# Patient Record
Sex: Male | Born: 1965 | Race: White | Hispanic: No | Marital: Married | Smoking: Never smoker
Health system: Southern US, Community
[De-identification: ages and names within clinical notes are randomized; demographics above are authoritative.]

## PROBLEM LIST (undated history)

## (undated) DIAGNOSIS — I10 Essential (primary) hypertension: Secondary | ICD-10-CM

## (undated) DIAGNOSIS — K219 Gastro-esophageal reflux disease without esophagitis: Secondary | ICD-10-CM

## (undated) DIAGNOSIS — N529 Male erectile dysfunction, unspecified: Secondary | ICD-10-CM

## (undated) DIAGNOSIS — E291 Testicular hypofunction: Secondary | ICD-10-CM

## (undated) DIAGNOSIS — T7840XA Allergy, unspecified, initial encounter: Secondary | ICD-10-CM

## (undated) DIAGNOSIS — F988 Other specified behavioral and emotional disorders with onset usually occurring in childhood and adolescence: Secondary | ICD-10-CM

## (undated) DIAGNOSIS — T783XXA Angioneurotic edema, initial encounter: Secondary | ICD-10-CM

## (undated) HISTORY — DX: Allergy, unspecified, initial encounter: T78.40XA

## (undated) HISTORY — DX: Testicular hypofunction: E29.1

## (undated) HISTORY — PX: VASECTOMY: SHX75

## (undated) HISTORY — DX: Essential (primary) hypertension: I10

## (undated) HISTORY — DX: Gastro-esophageal reflux disease without esophagitis: K21.9

## (undated) HISTORY — DX: Angioneurotic edema, initial encounter: T78.3XXA

## (undated) HISTORY — PX: COLONOSCOPY: SHX174

## (undated) HISTORY — DX: Male erectile dysfunction, unspecified: N52.9

## (undated) HISTORY — DX: Other specified behavioral and emotional disorders with onset usually occurring in childhood and adolescence: F98.8

---

## 2003-12-10 HISTORY — PX: APPENDECTOMY: SHX54

## 2003-12-28 ENCOUNTER — Encounter (INDEPENDENT_AMBULATORY_CARE_PROVIDER_SITE_OTHER): Payer: Self-pay | Admitting: Specialist

## 2003-12-28 ENCOUNTER — Inpatient Hospital Stay (HOSPITAL_COMMUNITY): Admission: EM | Admit: 2003-12-28 | Discharge: 2003-12-29 | Payer: Self-pay | Admitting: Emergency Medicine

## 2004-06-24 ENCOUNTER — Emergency Department (HOSPITAL_COMMUNITY): Admission: EM | Admit: 2004-06-24 | Discharge: 2004-06-24 | Payer: Self-pay | Admitting: Family Medicine

## 2006-05-21 ENCOUNTER — Ambulatory Visit: Payer: Self-pay | Admitting: Family Medicine

## 2008-07-20 ENCOUNTER — Ambulatory Visit: Payer: Self-pay | Admitting: Family Medicine

## 2008-11-02 ENCOUNTER — Ambulatory Visit: Payer: Self-pay | Admitting: Family Medicine

## 2009-01-30 ENCOUNTER — Ambulatory Visit: Payer: Self-pay | Admitting: Family Medicine

## 2009-03-15 ENCOUNTER — Ambulatory Visit: Payer: Self-pay | Admitting: Family Medicine

## 2009-08-13 ENCOUNTER — Ambulatory Visit: Payer: Self-pay | Admitting: Family Medicine

## 2009-08-29 ENCOUNTER — Ambulatory Visit: Payer: Self-pay | Admitting: Family Medicine

## 2010-06-26 ENCOUNTER — Ambulatory Visit: Payer: Self-pay | Admitting: Family Medicine

## 2010-07-08 ENCOUNTER — Encounter (INDEPENDENT_AMBULATORY_CARE_PROVIDER_SITE_OTHER): Payer: BC Managed Care – PPO | Admitting: Family Medicine

## 2010-07-08 DIAGNOSIS — F528 Other sexual dysfunction not due to a substance or known physiological condition: Secondary | ICD-10-CM

## 2010-07-08 DIAGNOSIS — Z0000 Encounter for general adult medical examination without abnormal findings: Secondary | ICD-10-CM

## 2010-07-08 DIAGNOSIS — K219 Gastro-esophageal reflux disease without esophagitis: Secondary | ICD-10-CM

## 2010-07-08 DIAGNOSIS — E291 Testicular hypofunction: Secondary | ICD-10-CM

## 2010-07-08 DIAGNOSIS — Z79899 Other long term (current) drug therapy: Secondary | ICD-10-CM

## 2010-08-22 NOTE — H&P (Signed)
Craig Byrd, Craig Byrd               ACCOUNT NO.:  [id number]   MEDICAL RECORD NO.:  [id number]          PATIENT TYPE:  EMS   LOCATION:  ED                           FACILITY:  [facility]   PHYSICIAN:  Adolph Pollack, M.D.DATE OF BIRTH:  Dec 17, 1965   DATE OF ADMISSION:  12/28/2003  DATE OF DISCHARGE:                                HISTORY & PHYSICAL   CHIEF COMPLAINT:  Right lower quadrant abdominal pain.   HISTORY OF PRESENT ILLNESS:  This 45 year old male was in the car and had  the gradual onset of right flank pain that worsened and radiated around to  the right lower quadrant and became more fixed here.  He had some nausea and  subjective fever and chills.  He actually went to see Dr. Susann Givens and was  feeling a little better at that time.  Dr. Susann Givens felt he had some right  lower quadrant tenderness on exam and the urinalysis was apparently  unremarkable.  He subsequently asked me to see him in consultation.  He has  not had anything like this before.  He had very mild dysuria in Dr.  Jola Babinski office.  No diarrhea present.  He was anorexic.   PAST MEDICAL HISTORY:  1.  GERD.  2.  ADHD.   DRUG ALLERGIES:  None.   MEDICATIONS:  1.  Adderall.  2.  Prilosec.   SOCIAL HISTORY:  He is married.  He is a Building control surveyor.  No tobacco or  alcohol use.   FAMILY HISTORY:  Positive for diabetes mellitus in his father.   REVIEW OF SYSTEMS:  CARDIOVASCULAR:  Denies heart disease or hypertension.  PULMONARY:  No chronic lung disease, no pneumonia, no asthma, no TB.  GI:  He does have hiatal hernia and gastroesophageal reflux as mentioned.  No  peptic ulcer disease, no diverticulitis, no colitis, no melena or  hematochezia.  GU:  No kidney stones.  ENDOCRINE:  No diabetes, thyroid  disease, or hypercholesterolemia.  NEUROLOGIC:  No seizure disorders.  HEMATOLOGIC:  No bleeding disorders or blood clots.   PHYSICAL EXAMINATION:  GENERAL:  Well-developed, well-nourished male in no  acute distress.  Pleasant, cooperative.  VITAL SIGNS:  His temperature is 98.2 orally, blood pressure of 134/84,  pulse of 84.  HEENT:  Eyes:  Extraocular muscles are intact.  No icterus.  SKIN:  Warm and dry.  No jaundice.  NECK:  Supple without masses or obvious thyroid enlargement.  RESPIRATORY:  Breath sounds equal and clear.  Respirations unlabored.  CARDIOVASCULAR:  Heart demonstrates a regular rate and rhythm.  No murmur  heard.  No lower extremity edema.  ABDOMEN:  Soft.  There is some mild to moderate right lower quadrant  tenderness to palpation and percussion.  No obvious involuntary guarding.  No masses palpable.  No hernias noted.  GENITOURINARY:  No penile lesions noted.  MUSCULOSKELETAL:  Good muscle tone, full range of motion.  NEUROLOGIC:  Alert and oriented and answers questions appropriately.   LABORATORY DATA:  White blood cell count normal 8700 with no left shift.  Hemoglobin 14.9.   A CT scan  demonstrates findings consistent with an early acute appendicitis.   IMPRESSION:  Acute appendicitis.   PLAN:  Laparoscopic/possible open appendectomy.  The procedure and risks  were discussed with him and his wife.  The risks include but are not limited  to bleeding, infection, injury to intraabdominal organs such as intestine,  bladder, etc., risk of general anesthesia.  He seems to understand this and  agrees to proceed.      TJR/MEDQ  D:  12/28/2003  T:  12/29/2003  Job:  [id number]   cc:   Sharlot Gowda, M.D.  [address]  [place], [place] [address]  Fax: [phone]

## 2010-08-22 NOTE — Op Note (Signed)
NAMEFREDERICO, Craig Byrd               ACCOUNT NO.:  [id number]   MEDICAL RECORD NO.:  [id number]          PATIENT TYPE:  EMS   LOCATION:  ED                           FACILITY:  [facility]   PHYSICIAN:  Adolph Pollack, M.D.DATE OF BIRTH:  Mar 27, 1966   DATE OF PROCEDURE:  12/28/2003  DATE OF DISCHARGE:                                 OPERATIVE REPORT   PREOPERATIVE DIAGNOSES:  Acute appendicitis.   POSTOPERATIVE DIAGNOSES:  Acute appendicitis.   PROCEDURE:  Laparoscopic appendectomy.   SURGEON:  Adolph Pollack, M.D.   ANESTHESIA:  General.   INDICATIONS FOR PROCEDURE:  This is a 45 year old male otherwise fairly  healthy who developed some right flank pain and right lower quadrant pain.  Had normal white blood cell count. Physical examination was not that  impressive. A CT scan was performed and was consistent with early acute  appendicitis and now is brought to the operating room.   TECHNIQUE:  He was placed supine on the operating table and a general  anesthetic was administered.  The hair on the abdominal wall was clipped. A  Foley catheter was placed in the bladder. The abdominal wall was sterilely  prepped and draped. Dilute Marcaine solution was infiltrated in the  subumbilical region and a small subumbilical incision was made incising the  skin, subcutaneous tissue, midline fascia, and peritoneum sharply. A  pursestring suture was then placed around the fascial edges.  A Hasson  trocar was introduced into the peritoneal cavity and pneumoperitoneum  created by insufflation of CO2 gas.   Next, a laparoscope was introduced and no underlying visceral injury was  noted.  He was placed in a Trendelenburg position, the right side tilted  upward.  A 5 mm trocar was placed in the left lower quadrant and one in the  right upper quadrant.  The appendix was identified and was noted to be  inflamed but no evidence of rupture and there was mild fibrinous debris  present.  The  appendix was grasped at the mesoappendix and retracted  anteriorly. The mesoappendix was then divided with the harmonic scalpel down  to the base of the appendix.  The appendix was then amputated off the cecum  with the endoGIA stapler, placed into an Endopouch bag, then removed from  the abdominal cavity through the subumbilical port.   The Hasson trocar was replaced. The staple line was inspected and there was  bleeding from the medial aspect of it.  This was able to be controlled with  application of a single hemoclip. Following this, 1 liter of normal saline  was used to irrigate the area.  The fluid was evacuated. The staple line was  solid without leakage and no further bleeding was noted.   The laparoscope was then placed in the right upper quadrant trocar and the  left lower quadrant trocar was removed and was hemostatic. The subumbilical  trocar was removed and under laparoscopic vision, the fascial defect was  closed by tightening up and tying down the pursestring suture.  The  laparoscope was removed, the pneumoperitoneum was released and the remaining  5 mm right upper quadrant trocar was removed.   The skin incisions were then closed with 4-0 Monocryl subcuticular stitches.  Steri-Strips and sterile dressings were applied.   He tolerated the procedure without any apparent complications and was taken  to the recovery room in satisfactory condition.      TJR/MEDQ  D:  12/28/2003  T:  12/29/2003  Job:  [id number]   cc:   Sharlot Gowda, M.D.  [address]  [place], [place] [address]  Fax: [phone]

## 2010-10-02 ENCOUNTER — Ambulatory Visit (INDEPENDENT_AMBULATORY_CARE_PROVIDER_SITE_OTHER): Payer: BC Managed Care – PPO | Admitting: Family Medicine

## 2010-10-02 ENCOUNTER — Encounter: Payer: Self-pay | Admitting: Family Medicine

## 2010-10-02 VITALS — BP 138/88 | HR 76 | Temp 98.1°F | Ht 72.0 in | Wt 215.0 lb

## 2010-10-02 DIAGNOSIS — R05 Cough: Secondary | ICD-10-CM

## 2010-10-02 MED ORDER — PREDNISONE 10 MG PO KIT: PACK | ORAL | Status: DC

## 2010-10-02 MED ORDER — BENZONATATE 200 MG PO CAPS: 200.0000 mg | ORAL_CAPSULE | Freq: Three times a day (TID) | ORAL | Status: AC | PRN

## 2010-10-02 NOTE — Patient Instructions (Signed)
Cough may be contributed by post-nasal drip.  I recommend use of antihistamine, +/- decongestant (ie. Claritin or Zyrtec, without or without the "D"), along with Mucinex (plain or DM).  Tessalon should also help with the cough and not cause drowsiness.  If cough persists through the weekend, will try a short course of steroids.

## 2010-10-02 NOTE — Progress Notes (Signed)
45 year old male presents to the office with complaint of cough.  Cough began while at the beach 2 weeks ago.  Cough was productive of green phlegm, and "got deep quick".  Had a rx for Levaquin, but wasn't better after 5 days, so went to an urgent care 6/20.  Treated with z-pak, Flovent, and hydrocodone cough syrup.  Started getting better after the first few days, but cough has gotten worse.  Denies fevers.  Feels a little clammy today, possibly from coughing spells.  No longer is coughing up phlegm, but still feels some drainage in throat at night.  + tickle in throat, dry cough.  Past Medical History  Diagnosis Date  . Allergy   . GERD (gastroesophageal reflux disease)   . Hypogonadism male   . Erectile dysfunction   . ADD (attention deficit disorder)     Past Surgical History  Procedure Date  . Appendectomy   . Vasectomy     History   Social History  . Marital Status: Single    Spouse Name: N/A    Number of Children: N/A  . Years of Education: N/A   Occupational History  . Not on file.   Social History Main Topics  . Smoking status: Never Smoker   . Smokeless tobacco: Never Used  . Alcohol Use: Yes     1-2 beers once a month  . Drug Use: No  . Sexually Active: Not on file   Other Topics Concern  . Not on file   Social History Narrative  . No narrative on file    Family History  Problem Relation Age of Onset  . Hypertension Mother   . Crohn's disease Father   . Cancer Father 64    colon cancer  . Diabetes Father   . Hypertension Sister     Current outpatient prescriptions:fluticasone (FLOVENT HFA) 110 MCG/ACT inhaler, Inhale 2 puffs into the lungs 2 (two) times daily.  , Disp: , Rfl: ;  lisdexamfetamine (VYVANSE) 40 MG capsule, Take 40 mg by mouth as needed.  , Disp: , Rfl: ;  omeprazole (PRILOSEC) 20 MG capsule, Take 20 mg by mouth daily.  , Disp: , Rfl:  benzonatate (TESSALON) 200 MG capsule, Take 1 capsule (200 mg total) by mouth 3 (three) times daily as  needed for cough., Disp: 30 capsule, Rfl: 0;  PredniSONE 10 MG KIT, Take as directed, Disp: 21 each, Rfl: 0  No Known Allergies  ROS: Denies fevers, sinus pain, chest pain, palpitations, insomnia, GI symptoms, skin rash or other conerns.  PHYSICAL EXAM: BP 138/88  Pulse 76  Temp(Src) 98.1 F (36.7 C) (Oral)  Ht 6' (1.829 m)  Wt 215 lb (97.523 kg)  BMI 29.16 kg/m2 Well developed, pleasant male, with intermittent dry, hacky cough.  Speaking easily in full sentences, no distress HEENT:  PERRL, EOMI, conjunctiva clear. Nose: moderate mucosal edema, L>R with white stringy mucus.  Sinuses nontender.  OP clear without erythema. Neck:  No lymphadenopathy or mass Lungs: clear bilaterally.  Good air movement.  No wheezes, rales or ronchi.  No cough with forced expiration Skin: no rash  ASSESSMENT/PLAN: 1. Cough  benzonatate (TESSALON) 200 MG capsule, PredniSONE 10 MG KIT   Cough may be contributed by post-nasal drip.  Recommend use of antihistamine, +/- decongestant (ie. Claritin or Zyrtec, without or without the "D"), along with Mucinex (plain or DM).  Tessalon should also help with the cough and not cause drowsiness.  If cough persists through the weekend, will  try a short course of steroids.  Patient prefers to have steroid Rx sent to pharmacy and left on file, rather than potentially calling over the weekend to get Rx.  Risks and side effects of steroids were reviewed with patient.

## 2010-10-07 ENCOUNTER — Ambulatory Visit (INDEPENDENT_AMBULATORY_CARE_PROVIDER_SITE_OTHER): Payer: BC Managed Care – PPO | Admitting: Medical

## 2010-10-07 ENCOUNTER — Encounter: Payer: Self-pay | Admitting: Medical

## 2010-10-07 ENCOUNTER — Other Ambulatory Visit: Payer: Self-pay

## 2010-10-07 ENCOUNTER — Encounter: Payer: Self-pay | Admitting: Emergency Medicine

## 2010-10-07 VITALS — BP 142/90 | HR 79 | Temp 98.0°F | Ht 72.0 in | Wt 217.0 lb

## 2010-10-07 DIAGNOSIS — R05 Cough: Secondary | ICD-10-CM

## 2010-10-07 LAB — CBC WITH DIFFERENTIAL/PLATELET
Basophils Relative: 0 % (ref 0–1)
Eosinophils Relative: 1 % (ref 0–5)
HCT: 46.1 % (ref 39.0–52.0)
MCV: 82.3 fL (ref 78.0–100.0)
Neutro Abs: 6.5 10*3/uL (ref 1.7–7.7)
Neutrophils Relative %: 70 % (ref 43–77)
Platelets: 308 10*3/uL (ref 150–400)
WBC: 9.3 10*3/uL (ref 4.0–10.5)

## 2010-10-07 MED ORDER — HYDROCODONE-HOMATROPINE 5-1.5 MG/5ML PO SYRP: 5.0000 mL | ORAL_SOLUTION | ORAL | Status: AC | PRN

## 2010-10-07 MED ORDER — ALBUTEROL SULFATE HFA 108 (90 BASE) MCG/ACT IN AERS: 2.0000 | INHALATION_SPRAY | Freq: Four times a day (QID) | RESPIRATORY_TRACT | Status: DC | PRN

## 2010-10-07 NOTE — Patient Instructions (Signed)
We will refer you to pulmonology for ongoing cough.   For now, continue the following:  Mucinex DM twice daily.  Benadryl or Zyrtec twice daily.  Flovent, 1 inhalation twice daily.  Finish prednisone dose pack.  You can use albuterol inhaler 2 puffs as needed every 4-6 hours.   You can also use Tessalon Perles 3 times daily for cough OR Hycodan cough syrup every 4-6 hours.

## 2010-10-07 NOTE — Progress Notes (Signed)
Subjective:   HPI Here for c/o ongoing cough. He is here with his wife today. He has been seen 3 times in the past 3 weeks for same c/o.  He had been seen prior at Urgent Care while out of town, and was here just last week.  At this point he has had cough, throat congestion, and productive sputum x 3 wk.  He has completed a round of Levaquin and Zpak, has been on Hydrocodone based cough syrup, Tessalon Perles, Flovent, and this past week was given Prednisone taper.   Over the weekend he even began using Albuterol inhaler which helped the first time he used it, but didn't help any other times.  He is taking Mucinex and Benadryl as well.  Wife notes that prior to 3 years ago he never had any issue resolving infections/URIs.  However, in the past 3 years, it takes weeks with severe cough to get over illness.  He is confused to why he gets severe cough now.  His current cough is so bad that he can't get his breath at times.    Regarding exposures and factors, they do have 2 dogs and carpet in the home.  He has hx/o GERD.  He is a Building control surveyor, and is a Occupational hygienist.  He denies any chemical or other worrisome exposures.   He is a lifetime nonsmoker.  Denies hx/o lung or allergy problems.  Denies family hx/o lung or heart disease.  He does note scraping off a popcorn ceiling 1.5 years ago without using a mask.  No other exposure.    He has been flying and traveling back and forth to the beach, but no other long travel, no recent surgery or procedure, no hx/o cancer, DVT, or PE.    No other aggravating or relieving factors.  No other c/o.  The following portions of the patient's history were reviewed and updated as appropriate: allergies, current medications, past family history, past medical history, past social history, past surgical history and problem list.  Past Medical History  Diagnosis Date  . Allergy   . GERD (gastroesophageal reflux disease)   . Hypogonadism male   . Erectile dysfunction   . ADD  (attention deficit disorder)    Review of Systems Gen.: No weight loss, fever, chills, sweats, anorexia Skin, no redness, rash ENT: No sore throat, ear pain, runny nose, sinus pressure Heart: No chest pain, palpitations, edema Lungs: Positive short of breath with cough, no dyspnea on exertion, no wheezing, no hemoptysis Abdomen: No pain, nausea, vomiting, diarrhea     Objective:   Physical Exam  General appearance: alert, no distress, WD/WN, white male Skin: Warm, dry HEENT: Conjunctiva normal, TMs pearly, nares patent without discharge, pharynx without erythema  Neck: Non-tender, no lymphadenopathy, no mass, no thyromegaly Heart: RRR, normal S1, S2, no murmurs Lungs: CTA bilaterally, no wheezes, rhonchi, rales Abdomen: Nontender, no mass, no organomegaly Extremities: No edema, no clubbing, no calf tenderness     Assessment :    Encounter Diagnosis  Name Primary?  . Cough, persistent Yes      Plan:     CXR today with patchiness of left lower lung field overshadowing cardiac silhouette, otherwise no acute changes. We will send x-ray for over read.  CBC sent as well. I suspect his symptoms initially started as an infection or bronchitis, but other allergens or postnasal drip probably aggravated things. I advised that he continue over-the-counter antihistamine twice a day, Mucinex DM twice daily, Flovent twice daily, Hycodan  cough syrup every 4-6 hours when necessary, albuterol inhaler as needed, finish the prednisone taper.  Given his recurrence of similar episodes several times in the last few years, that have led to persistent severe cough despite reasonable medication, we will refer her to pulmonology for further evaluation.

## 2010-10-09 ENCOUNTER — Ambulatory Visit (INDEPENDENT_AMBULATORY_CARE_PROVIDER_SITE_OTHER): Payer: BC Managed Care – PPO | Admitting: Emergency Medicine

## 2010-10-09 ENCOUNTER — Encounter: Payer: Self-pay | Admitting: Emergency Medicine

## 2010-10-09 ENCOUNTER — Telehealth: Payer: Self-pay | Admitting: *Deleted

## 2010-10-09 VITALS — BP 118/80 | HR 94 | Temp 98.0°F | Ht 72.0 in | Wt 219.4 lb

## 2010-10-09 DIAGNOSIS — R059 Cough, unspecified: Secondary | ICD-10-CM

## 2010-10-09 DIAGNOSIS — R053 Chronic cough: Secondary | ICD-10-CM | POA: Insufficient documentation

## 2010-10-09 DIAGNOSIS — R05 Cough: Secondary | ICD-10-CM

## 2010-10-09 NOTE — Progress Notes (Signed)
Subjective:    Patient ID: Craig Byrd, male    DOB: 1965/11/02, 45 y.o.   MRN: [id number]  HPI 45 yo never smoker, little PMH, presents today for cough. He typically feels well, no daily cough, but he has episodic flares that can last for weeks to months. He began to have green mucous from nose and chest about 3.5 weeks ago, was treated for bronchitis w levaquin - the green mucous improved but he has continued to cough.    Review of Systems  As per HPI.   Past Medical History  Diagnosis Date  . Allergy   . GERD (gastroesophageal reflux disease)   . Hypogonadism male   . Erectile dysfunction   . ADD (attention deficit disorder)      Family History  Problem Relation Age of Onset  . Hypertension Mother   . Crohn's disease Father   . Cancer Father 76    colon cancer  . Diabetes Father   . Hypertension Sister   . COPD Neg Hx      History   Social History  . Marital Status: Single    Spouse Name: N/A    Number of Children: N/A  . Years of Education: N/A   Occupational History  . commercial diver    Social History Main Topics  . Smoking status: Never Smoker   . Smokeless tobacco: Never Used  . Alcohol Use: Yes     1-2 beers once a month  . Drug Use: No  . Sexually Active: Not on file   Other Topics Concern  . Not on file   Social History Narrative  . No narrative on file     No Known Allergies   Outpatient Prescriptions Prior to Visit  Medication Sig Dispense Refill  . albuterol (VENTOLIN HFA) 108 (90 BASE) MCG/ACT inhaler Inhale 2 puffs into the lungs every 6 (six) hours as needed for wheezing.  1 each  0  . benzonatate (TESSALON) 200 MG capsule Take 1 capsule (200 mg total) by mouth 3 (three) times daily as needed for cough.  30 capsule  0  . diphenhydrAMINE (BENADRYL ALLERGY) 25 mg capsule Take 25 mg by mouth every 6 (six) hours as needed.        . fluticasone (FLOVENT HFA) 110 MCG/ACT inhaler Inhale 2 puffs into the lungs 2 (two) times daily.        Marland Kitchen  HYDROcodone-homatropine (HYCODAN) 5-1.5 MG/5ML syrup Take 5 mLs by mouth every 4 (four) hours as needed for cough.  120 mL  0  . lisdexamfetamine (VYVANSE) 40 MG capsule Take 40 mg by mouth as needed.        Marland Kitchen omeprazole (PRILOSEC) 20 MG capsule Take 20 mg by mouth daily.        . PredniSONE 10 MG KIT Take as directed  21 each  0         Objective:   Physical Exam  Gen: Pleasant, well-nourished, in no distress,  normal affect  ENT: No lesions,  mouth clear,  oropharynx clear, no postnasal drip, hoarse voice  Neck: No JVD, no TMG, no carotid bruits  Lungs: No use of accessory muscles, no dullness to percussion, clear without rales or rhonchi  Cardiovascular: RRR, heart sounds normal, no murmur or gallops, no peripheral edema  Musculoskeletal: No deformities, no cyanosis or clubbing  Neuro: alert, non focal  Skin: Warm, no lesions or rashes        Assessment & Plan:  Chronic cough Please start  taking your omeprazole 20mg  twice a day for the next 2 weeks Start taking loratadine 10mg  daily in addition to your evening benadryl Stop flovent Keep your albuterol available to use as needed Follow up with Dr Delton Coombes in 3 -4 weeks

## 2010-10-09 NOTE — Patient Instructions (Signed)
Please start taking your omeprazole 20mg twice a day for the next 2 weeks Start taking loratadine 10mg daily in addition to your evening benadryl Stop flovent Keep your albuterol available to use as needed Follow up with Dr Jerrel Tiberio in 3 -4 weeks 

## 2010-10-09 NOTE — Assessment & Plan Note (Signed)
Please start taking your omeprazole 20mg  twice a day for the next 2 weeks Start taking loratadine 10mg  daily in addition to your evening benadryl Stop flovent Keep your albuterol available to use as needed Follow up with Dr Delton Coombes in 3 -4 weeks

## 2010-10-09 NOTE — Telephone Encounter (Addendum)
Message copied by Dorthula Perfect on Thu Oct 09, 2010  8:06 AM ------      Message from: Aleen Campi, DAVID S      Created: Thu Oct 09, 2010  7:43 AM       Blood count is normal.  C/t with plan to see pulmonology.   Pt notified of lab results.  Scheduled to see pulmonologist 10-09-10 at 10:30 am and pt was reminded.  CM,LPN

## 2010-11-03 ENCOUNTER — Telehealth: Payer: Self-pay

## 2010-11-03 MED ORDER — CLINDAMYCIN HCL 150 MG PO CAPS: 150.0000 mg | ORAL_CAPSULE | Freq: Three times a day (TID) | ORAL | Status: AC

## 2010-11-03 MED ORDER — CLINDAMYCIN HCL 150 MG PO CAPS: 150.0000 mg | ORAL_CAPSULE | Freq: Three times a day (TID) | ORAL | Status: DC

## 2010-11-03 NOTE — Telephone Encounter (Signed)
Sent Clindamycin 150mg  1 po TID x 10 days #30 with no refills  to [facility] at [phone].  Pt aware.  CM, LPN

## 2010-11-03 NOTE — Telephone Encounter (Signed)
:   The clindamycin 150mg   3 times a day for 10 days

## 2010-11-03 NOTE — Telephone Encounter (Signed)
Apparently the patient saw Dr. Joneen Roach has been put on clindamycin. It did help over Dr. Haroldine Laws is out of his office. I will give him another 10 days he will follow up with both me and Haroldine Laws

## 2010-11-03 NOTE — Telephone Encounter (Signed)
Craig Byrd still no better x 7 weeks has seen Dr.Knapp and Shane over this course also he has been sent to ENT and Pulmonary with no result pt is addiment about you please call him and advise of where to go from here

## 2010-11-05 ENCOUNTER — Other Ambulatory Visit: Payer: Self-pay

## 2010-11-05 MED ORDER — OMEPRAZOLE 20 MG PO CPDR: 20.0000 mg | DELAYED_RELEASE_CAPSULE | Freq: Every day | ORAL | Status: DC

## 2010-11-07 ENCOUNTER — Encounter: Payer: Self-pay | Admitting: Family Medicine

## 2010-11-13 ENCOUNTER — Ambulatory Visit: Payer: BC Managed Care – PPO | Admitting: Emergency Medicine

## 2010-11-20 ENCOUNTER — Encounter: Payer: Self-pay | Admitting: Family Medicine

## 2010-11-20 ENCOUNTER — Ambulatory Visit (INDEPENDENT_AMBULATORY_CARE_PROVIDER_SITE_OTHER): Payer: BC Managed Care – PPO | Admitting: Family Medicine

## 2010-11-20 VITALS — BP 128/82 | HR 90 | Wt 216.0 lb

## 2010-11-20 DIAGNOSIS — R05 Cough: Secondary | ICD-10-CM

## 2010-11-20 NOTE — Progress Notes (Signed)
  Subjective:    Patient ID: Craig Byrd, male    DOB: [birth year], 45 y.o.   MRN: [id number]  HPI  he is here for evaluation of continued difficulty with coughing. He has been on multiple antibiotics, seen by pulmonary as well as ENT. He was placed on clindamycin by ENT which apparently also helps slightly. I renew that medication and he states that he was even better after completing that. He states that he was 95% better after finishing the second dose of clindamycin There is some evidence of reflux. He has been taking Prilosec 2 per day and but now is only taking one a day. He was last seen by ENT on August 8. Apparently the esophagitis was apparently improved. He was also seen recently in the emergency room because of rib pain do to coughing. Cough is getting worse and he is noting a slight hoarseness in his voice. Also some PND. He has had a Z-Pak, Levaquin and steroids for his cough in the past.  Review of Systems     Objective:   Physical Exam the notes from ENT and pulmonary were reviewed alert and in no distress. Tympanic membranes and canals are normal. Throat is clear. Tonsils are normal. Neck is supple without adenopathy or thyromegaly. Cardiac exam shows a regular sinus rhythm without murmurs or gallops. Lungs are clear to auscultation.        Assessment & Plan:  Chronic cough I will start initially with Prilosec 40 mg. He is to call me in 10 days and we will proceed further at that point. Do not think that antibiotics at this point would be prudent

## 2010-11-20 NOTE — Patient Instructions (Signed)
Take 2 Prilosec every day and call me in 10 days

## 2011-03-11 ENCOUNTER — Other Ambulatory Visit: Payer: Self-pay | Admitting: Internal Medicine

## 2011-03-12 ENCOUNTER — Other Ambulatory Visit: Payer: Self-pay

## 2011-03-12 MED ORDER — TESTOSTERONE 30 MG/ACT TD SOLN: 2.0000 "application " | TRANSDERMAL | Status: DC

## 2011-03-12 NOTE — Telephone Encounter (Signed)
Looks like jcl has already ordered pt will set up appt

## 2011-03-12 NOTE — Telephone Encounter (Signed)
Pt told to make a med check appt called med in for 30 days

## 2011-03-12 NOTE — Telephone Encounter (Signed)
He needs an appointment for followup including blood work

## 2011-08-04 ENCOUNTER — Telehealth: Payer: Self-pay | Admitting: Internal Medicine

## 2011-08-04 MED ORDER — OMEPRAZOLE 20 MG PO CPDR: 20.0000 mg | DELAYED_RELEASE_CAPSULE | Freq: Every day | ORAL | Status: DC

## 2011-08-04 NOTE — Telephone Encounter (Signed)
SENT MED IN 

## 2011-11-17 ENCOUNTER — Telehealth: Payer: Self-pay | Admitting: Family Medicine

## 2011-11-17 NOTE — Telephone Encounter (Signed)
Received call from Myrlene Broker, [facility] officer. 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

## 2012-03-18 ENCOUNTER — Other Ambulatory Visit: Payer: Self-pay | Admitting: Otolaryngology

## 2012-03-18 ENCOUNTER — Telehealth: Payer: Self-pay | Admitting: Family Medicine

## 2012-03-18 ENCOUNTER — Ambulatory Visit (INDEPENDENT_AMBULATORY_CARE_PROVIDER_SITE_OTHER): Payer: BC Managed Care – PPO | Admitting: Medical

## 2012-03-18 ENCOUNTER — Ambulatory Visit
Admission: RE | Admit: 2012-03-18 | Discharge: 2012-03-18 | Disposition: A | Discharge status: Home / Self Care | Payer: BC Managed Care – PPO | Source: Ambulatory Visit | Attending: Otolaryngology | Admitting: Otolaryngology

## 2012-03-18 ENCOUNTER — Encounter: Payer: Self-pay | Admitting: Medical

## 2012-03-18 ENCOUNTER — Telehealth: Payer: Self-pay | Admitting: Internal Medicine

## 2012-03-18 VITALS — BP 130/80 | HR 72 | Temp 98.2°F | Resp 16 | Wt 220.0 lb

## 2012-03-18 DIAGNOSIS — R053 Chronic cough: Secondary | ICD-10-CM

## 2012-03-18 DIAGNOSIS — R05 Cough: Secondary | ICD-10-CM

## 2012-03-18 DIAGNOSIS — K219 Gastro-esophageal reflux disease without esophagitis: Secondary | ICD-10-CM

## 2012-03-18 DIAGNOSIS — E291 Testicular hypofunction: Secondary | ICD-10-CM

## 2012-03-18 MED ORDER — OMEPRAZOLE 20 MG PO CPDR: 20.0000 mg | DELAYED_RELEASE_CAPSULE | Freq: Every day | ORAL | Status: DC

## 2012-03-18 NOTE — Telephone Encounter (Signed)
Message copied by Joslyn Hy on Fri Mar 18, 2012  4:17 PM ------      Message from: Jac Canavan      Created: Fri Mar 18, 2012  4:11 PM       i looked back in the chart.  I can't find a prior abnormal testosterone lab.  The last one we did was normal.  i can't justify prescribing testosterone without a low test result.  Thus, lets get the CXR and pulmonary studies regarding his main c/o cough.              F/u at his convenience for physical and to discuss testosterone, check labs to see if low.

## 2012-03-18 NOTE — Progress Notes (Signed)
Subjective: Here for c/o cough, similar to what we saw him for last year.  He notes that he caught a cold at Thanksgiving, turned into bronchitis.   Went to urgent care out of town, was put on antibiotic, this helped, but never fully got better over 50%.  subsequently saw ENT.  They treated with zpak, then with Levaquin when he didn't improve.  He notes feeling better, but only to about 80% or normal.  Dr. Joneen Roach advised that since he isn't completely resolving to come here for repeat CXR.  He is a nonsmoker.  In general he continues to get cough regularly.  Had workup last year after initial conservative measures weren't helping.  He says a specific cause wasn't determined.   He is taking omeprazole, but denies specific GERD and is not eating particular aggravating foods.  He does have slight nasal congestion, occasional sneezing, no sore throat, but no itching,m SOB, wheezing.  He denies work exposures.   He has 2 dogs, no other animal in the house.  Was on Androgel earlier this year, ended up stopping.  Wants to go back on TST therapy but wants shots instead.  Doesn't like to use the gels.    Past Medical History  Diagnosis Date  . Allergy   . GERD (gastroesophageal reflux disease)   . Hypogonadism male   . Erectile dysfunction   . ADD (attention deficit disorder)    ROS as in HPI    Objective:   Physical Exam  Filed Vitals:   03/18/12 1037  BP: 130/80  Pulse: 72  Temp: 98.2 F (36.8 C)  Resp: 16    General appearance: alert, no distress, WD/WN, white male HEENT: normocephalic, sclerae anicteric, TMs pearly, nares patent, no discharge or erythema, pharynx normal Oral cavity: MMM, no lesions Neck: supple, no lymphadenopathy, no thyromegaly, no masses Heart: RRR, normal S1, S2, no murmurs Lungs: CTA bilaterally, no wheezes, rhonchi, or rales Abdomen: +bs, soft, non tender, non distended, no masses, no hepatomegaly, no splenomegaly Pulses: 2+ symmetric Ext: no  edema   Assessment and Plan :    Encounter Diagnoses  Name Primary?  . Chronic cough Yes  . GERD (gastroesophageal reflux disease)   . Hypogonadism male    Chronic cough - reviewed pulmonology notes and OV notes from last year where we saw him multiple times for the same.   It was thought that he had GERD triggers of cough last year.   However, he has been using omeprazole regularly, and last year had used multiple antibiotics, PPIs, antihistamines, cough syrup, inhalers, and nothing seemed to fully resolve the cough.  Reviewed recent ENT notes.  At this point, repeat CXR, will set up PFTs.  May end up getting labs, but will start with this.  Begin Nexium samples.   Begin OTC Zyrtec if not improving in 1 wk.    GERD - refilled omeprazole, but don't start until after trial of Nexium  Hypogonadism - had been on Androgel prior.  Wants injections.  F/u in 40mo for physical, recheck on hypogonadism.  His wife is a Teacher, early years/pre and can administer the injections.   Follow-up pending studies.

## 2012-03-18 NOTE — Telephone Encounter (Signed)
Pt notified of shanes notes about testosterone

## 2012-03-18 NOTE — Patient Instructions (Signed)
Begin sample of daily Nexium, 1 capsule 30-45 minutes before breakfast.  Try this for 2 weeks.  If not improving, then add Zyrtec or Allegra allergy pill daily OTC.    We will get an updated chest xray and lungs studies.

## 2012-03-18 NOTE — Telephone Encounter (Signed)
Patient is aware of his PFT appointment on 04/08/11 @ 1000 am. CLS [facility]

## 2012-03-18 NOTE — Telephone Encounter (Signed)
Left a message for pt to call me back

## 2012-04-27 ENCOUNTER — Ambulatory Visit (INDEPENDENT_AMBULATORY_CARE_PROVIDER_SITE_OTHER): Payer: BC Managed Care – PPO | Admitting: Internal Medicine

## 2012-04-27 DIAGNOSIS — R05 Cough: Secondary | ICD-10-CM

## 2012-04-27 LAB — PULMONARY FUNCTION TEST

## 2012-04-27 NOTE — Progress Notes (Signed)
PFT done today. Katie Welchel,CMA  

## 2012-05-04 ENCOUNTER — Telehealth: Payer: Self-pay | Admitting: Family Medicine

## 2012-05-04 NOTE — Telephone Encounter (Signed)
Patient states that he is better and his cough his better as well. CLS  Patient is aware of his PFT results. CLS

## 2012-05-04 NOTE — Telephone Encounter (Signed)
Message copied by Janeice Robinson on Wed May 04, 2012  2:16 PM ------      Message from: Aleen Campi, DAVID S      Created: Wed May 04, 2012  4:31 AM       Call and see how he is doing?  Is he still coughing?             His pulmonary function studies were completely normal on 04/27/12

## 2012-05-04 NOTE — Telephone Encounter (Signed)
If the cough is completely gone, then fine, no need to return right away.  However, if some residual cough or other symptoms ongoing ( chest pain, SOB, wheezing, out of breath with exercise), then I would suggest follow up to explore/rule out other causes as asthma or lung disease doesn't seem to be the issue specifically.

## 2012-05-06 NOTE — Telephone Encounter (Signed)
Cough is completely gone. Crosby Oyster PA-C is aware of this. CLS

## 2012-09-29 ENCOUNTER — Telehealth: Payer: Self-pay | Admitting: Family Medicine

## 2012-09-29 MED ORDER — OMEPRAZOLE 20 MG PO CPDR: 20.0000 mg | DELAYED_RELEASE_CAPSULE | Freq: Every day | ORAL | Status: DC

## 2012-09-29 NOTE — Telephone Encounter (Signed)
SENT MED IN 

## 2012-12-07 ENCOUNTER — Ambulatory Visit (INDEPENDENT_AMBULATORY_CARE_PROVIDER_SITE_OTHER): Payer: BC Managed Care – PPO | Admitting: Family Medicine

## 2012-12-07 ENCOUNTER — Encounter: Payer: Self-pay | Admitting: Family Medicine

## 2012-12-07 VITALS — BP 122/82 | HR 60 | Wt 217.0 lb

## 2012-12-07 DIAGNOSIS — Z23 Encounter for immunization: Secondary | ICD-10-CM

## 2012-12-07 DIAGNOSIS — R6882 Decreased libido: Secondary | ICD-10-CM

## 2012-12-07 DIAGNOSIS — E291 Testicular hypofunction: Secondary | ICD-10-CM

## 2012-12-07 DIAGNOSIS — N529 Male erectile dysfunction, unspecified: Secondary | ICD-10-CM

## 2012-12-07 LAB — CBC WITH DIFFERENTIAL/PLATELET
Basophils Relative: 0 % (ref 0–1)
Eosinophils Absolute: 0.1 10*3/uL (ref 0.0–0.7)
Lymphocytes Relative: 22 % (ref 12–46)
Lymphs Abs: 1.6 10*3/uL (ref 0.7–4.0)
Monocytes Absolute: 0.6 10*3/uL (ref 0.1–1.0)
Monocytes Relative: 8 % (ref 3–12)
Neutrophils Relative %: 69 % (ref 43–77)
RDW: 13.8 % (ref 11.5–15.5)
WBC: 7.2 10*3/uL (ref 4.0–10.5)

## 2012-12-07 LAB — COMPREHENSIVE METABOLIC PANEL
ALT: 26 U/L (ref 0–53)
AST: 19 U/L (ref 0–37)
Alkaline Phosphatase: 58 U/L (ref 39–117)
BUN: 11 mg/dL (ref 6–23)
Calcium: 10.1 mg/dL (ref 8.4–10.5)
Chloride: 103 mEq/L (ref 96–112)
Creat: 1.06 mg/dL (ref 0.50–1.35)
Potassium: 4.5 mEq/L (ref 3.5–5.3)
Sodium: 140 mEq/L (ref 135–145)

## 2012-12-07 LAB — LIPID PANEL
Total CHOL/HDL Ratio: 5.2 Ratio
Triglycerides: 123 mg/dL (ref <150)

## 2012-12-07 MED ORDER — TESTOSTERONE 20.25 MG/1.25GM (1.62%) TD GEL: 2.0000 | TRANSDERMAL | Status: DC

## 2012-12-07 MED ORDER — VARDENAFIL HCL 10 MG PO TABS: 10.0000 mg | ORAL_TABLET | Freq: Every day | ORAL | Status: DC | PRN

## 2012-12-07 NOTE — Progress Notes (Signed)
  Subjective:    Patient ID: Craig Byrd, male    DOB: [birth year], 47 y.o.   MRN: [id number]  HPI He is here for recheck. Apparently in 2012 he was given testosterone however to the electronic record does not have it listed. He was given Axiron however he did not like this medication. He is also noting difficulty with erectile dysfunction. He had tried Cialis in the past with good success.   Review of Systems     Objective:   Physical Exam Alert and in no distress otherwise not examined       Assessment & Plan:  Libido, decreased - Plan: CBC with Differential, Comprehensive metabolic panel, Lipid panel, Testosterone, PSA  ED (erectile dysfunction) - Plan: vardenafil (LEVITRA) 10 MG tablet  Hypogonadism male - Plan: CBC with Differential, Comprehensive metabolic panel, Lipid panel, Testosterone, PSA  Immunization due - Plan: Flu Vaccine QUAD 36+ mos IM  I will get routine blood screening on him including testosterone and PSA. Levitra was covered better by his insurance and therefore that was given. He was also given a flu shot. Risks and benefits were discussed.

## 2012-12-08 ENCOUNTER — Other Ambulatory Visit: Payer: Self-pay

## 2012-12-08 ENCOUNTER — Telehealth: Payer: Self-pay | Admitting: Family Medicine

## 2012-12-08 NOTE — Progress Notes (Signed)
WIFE WANTED THE MEDS CHANGED

## 2012-12-08 NOTE — Telephone Encounter (Signed)
CALLED AND LEFT MESSAGE FOR AMY TO CALL BACK IF I DONT ANSWER PHONE TO PLEASE LEAVE ME A MESSAGE OF WHAT THEY WANT CHANGED AND I WILL DO MY BEST TO GET THIS DONE

## 2013-01-10 ENCOUNTER — Telehealth: Payer: Self-pay | Admitting: Family Medicine

## 2013-01-10 NOTE — Telephone Encounter (Signed)
Pt called for refill on Androgel. System show that he shld have 4 more refills left but pt states rcvd paper script & med states not refills. He filled at [facility]. [facility] verified pt had paper script is Sept with no refills. Pt wants refils sent to [facility] & a call to left him know that refill was sent.

## 2013-01-10 NOTE — Telephone Encounter (Signed)
Give him refills for the next 6 months

## 2013-01-11 ENCOUNTER — Other Ambulatory Visit: Payer: Self-pay

## 2013-01-11 MED ORDER — TESTOSTERONE 20.25 MG/1.25GM (1.62%) TD GEL: 2.0000 | TRANSDERMAL | Status: DC

## 2013-01-11 NOTE — Telephone Encounter (Signed)
androgel called in

## 2013-01-12 NOTE — Telephone Encounter (Signed)
Done

## 2013-02-16 ENCOUNTER — Telehealth: Payer: Self-pay | Admitting: Family Medicine

## 2013-02-16 NOTE — Telephone Encounter (Signed)
Pt called to see if he can just come in for labs to get his testosterone levels checked without having to be seen by you. Is this ok?

## 2013-02-16 NOTE — Telephone Encounter (Signed)
  For him to just have the testosterone drawn

## 2013-02-16 NOTE — Telephone Encounter (Signed)
Pt will come in tomorrow for a testosterone draw

## 2013-02-17 ENCOUNTER — Other Ambulatory Visit: Payer: BC Managed Care – PPO

## 2013-02-17 DIAGNOSIS — R7989 Other specified abnormal findings of blood chemistry: Secondary | ICD-10-CM

## 2013-02-18 LAB — TESTOSTERONE: Testosterone: 363 ng/dL (ref 300–890)

## 2013-02-20 ENCOUNTER — Other Ambulatory Visit: Payer: Self-pay

## 2013-02-20 DIAGNOSIS — R7989 Other specified abnormal findings of blood chemistry: Secondary | ICD-10-CM

## 2013-02-20 MED ORDER — TESTOSTERONE 20.25 MG/1.25GM (1.62%) TD GEL: 3.0000 | TRANSDERMAL | Status: DC

## 2013-02-20 NOTE — Progress Notes (Signed)
Quick Note:  PT IS AWARE MED CALLED IN APPOINTMENT 12/17 NURSE VISIT ______

## 2013-02-20 NOTE — Telephone Encounter (Signed)
CALLED UPDATE ON TEST. 3 PUMPS PER DAY I HAVE CALLED PT TO INFORM HIM OF REFILL AND TO COME BACK IN ONE MONTH FOR NURSE VISIT FOR RECHECK

## 2013-03-22 ENCOUNTER — Other Ambulatory Visit: Payer: BC Managed Care – PPO

## 2013-03-22 DIAGNOSIS — R7989 Other specified abnormal findings of blood chemistry: Secondary | ICD-10-CM

## 2013-03-22 LAB — TESTOSTERONE: Testosterone: 415 ng/dL (ref 300–890)

## 2013-04-11 ENCOUNTER — Telehealth: Payer: Self-pay | Admitting: Internal Medicine

## 2013-04-11 MED ORDER — OMEPRAZOLE 20 MG PO CPDR: 20.0000 mg | DELAYED_RELEASE_CAPSULE | Freq: Every day | ORAL | Status: DC

## 2013-04-11 NOTE — Telephone Encounter (Signed)
Refill request for omeprazole 20mg  #90 to [place]

## 2013-04-29 ENCOUNTER — Telehealth: Payer: Self-pay | Admitting: Family Medicine

## 2013-05-09 ENCOUNTER — Ambulatory Visit (INDEPENDENT_AMBULATORY_CARE_PROVIDER_SITE_OTHER): Payer: No Typology Code available for payment source | Admitting: Family Medicine

## 2013-05-09 ENCOUNTER — Other Ambulatory Visit: Payer: Self-pay | Admitting: *Deleted

## 2013-05-09 VITALS — BP 134/100 | HR 88 | Wt 226.0 lb

## 2013-05-09 DIAGNOSIS — E291 Testicular hypofunction: Secondary | ICD-10-CM

## 2013-05-09 DIAGNOSIS — N529 Male erectile dysfunction, unspecified: Secondary | ICD-10-CM

## 2013-05-09 MED ORDER — TADALAFIL 5 MG PO TABS: 5.0000 mg | ORAL_TABLET | Freq: Every day | ORAL | Status: DC | PRN

## 2013-05-09 MED ORDER — TESTOSTERONE 50 MG/5GM (1%) TD GEL: 5.0000 g | Freq: Every day | TRANSDERMAL | Status: DC

## 2013-05-09 NOTE — Telephone Encounter (Signed)
Go ahead and put him on the Testim and have them checked again in about a month

## 2013-05-09 NOTE — Telephone Encounter (Signed)
Called pharmacy & changed Androgel to Testim  But was told by pharmacist that cost would be $463.11 a month because pt has a $5,000 deductible.  She suggested injectable Depo testosterone 200mg  per ml, cost for 10 doses $86.49 which would be a 5 month supply if given twice a month.

## 2013-05-09 NOTE — Progress Notes (Signed)
   Subjective:    Patient ID: Craig Byrd, male    DOB: 01-08-1966, 48 y.o.   MRN: [id number]  HPI He is here for consult concerning hypogonadism. His insurance will not cover the medication as he has a very high deductible. He would also like a prescription for Cialis. He does have a previous history of erectile dysfunction and can get the medication for a period   Review of Systems     Objective:   Physical Exam Alert and in no distress otherwise not examined       Assessment & Plan:  Hypogonadism male  ED (erectile dysfunction) - Plan: tadalafil (CIALIS) 5 MG tablet  but discussed options concerning testosterone and will give him a compounded testosterone preparation. He is to use 2 cc at a time and return here in one month.

## 2013-05-09 NOTE — Telephone Encounter (Signed)
Testosterone 5% cream called into pharmacy. 70mL qd

## 2013-05-09 NOTE — Telephone Encounter (Signed)
P.A. Irena Reichmann denied, needs trial/failure of Testim.  Do you want to switch?

## 2013-05-10 NOTE — Telephone Encounter (Signed)
JCL switched pt to compounded

## 2013-06-07 ENCOUNTER — Encounter: Payer: Self-pay | Admitting: Family Medicine

## 2013-06-07 ENCOUNTER — Ambulatory Visit (INDEPENDENT_AMBULATORY_CARE_PROVIDER_SITE_OTHER): Payer: No Typology Code available for payment source | Admitting: Family Medicine

## 2013-06-07 VITALS — BP 120/90 | HR 72 | Wt 224.0 lb

## 2013-06-07 DIAGNOSIS — E291 Testicular hypofunction: Secondary | ICD-10-CM

## 2013-06-07 NOTE — Progress Notes (Signed)
   Subjective:    Patient ID: Jakobee Byrd, male    DOB: 01/13/1966, 48 y.o.   MRN: [id number]  HPI He is here for a recheck. He notes no change in his energy or stamina.  Review of Systems     Objective:   Physical Exam Alert and in no distress otherwise not examined      Assessment & Plan:  Hypogonadism male - Plan: Testosterone

## 2013-06-08 LAB — TESTOSTERONE: TESTOSTERONE: 531 ng/dL (ref 300–890)

## 2013-10-20 ENCOUNTER — Telehealth: Payer: Self-pay

## 2013-10-20 NOTE — Telephone Encounter (Signed)
Refill request for Omeprazole DR 20mg  #90 sent to [place]

## 2013-10-23 ENCOUNTER — Other Ambulatory Visit: Payer: Self-pay

## 2013-10-23 MED ORDER — OMEPRAZOLE 20 MG PO CPDR: 20.0000 mg | DELAYED_RELEASE_CAPSULE | Freq: Every day | ORAL | Status: DC

## 2013-10-23 NOTE — Telephone Encounter (Signed)
done

## 2013-10-23 NOTE — Telephone Encounter (Signed)
Sent in med as pt request

## 2014-02-06 ENCOUNTER — Ambulatory Visit
Admission: RE | Admit: 2014-02-06 | Discharge: 2014-02-06 | Disposition: A | Discharge status: Home / Self Care | Payer: No Typology Code available for payment source | Source: Ambulatory Visit | Attending: Otolaryngology | Admitting: Otolaryngology

## 2014-02-06 ENCOUNTER — Other Ambulatory Visit: Payer: Self-pay | Admitting: Otolaryngology

## 2014-02-06 DIAGNOSIS — R053 Chronic cough: Secondary | ICD-10-CM

## 2014-02-06 DIAGNOSIS — R05 Cough: Secondary | ICD-10-CM

## 2014-04-09 ENCOUNTER — Encounter: Payer: Self-pay | Admitting: Internal Medicine

## 2014-04-09 ENCOUNTER — Ambulatory Visit (INDEPENDENT_AMBULATORY_CARE_PROVIDER_SITE_OTHER): Payer: No Typology Code available for payment source | Admitting: Internal Medicine

## 2014-04-09 ENCOUNTER — Other Ambulatory Visit: Payer: No Typology Code available for payment source

## 2014-04-09 VITALS — BP 120/88 | HR 83 | Ht 72.0 in | Wt 231.2 lb

## 2014-04-09 DIAGNOSIS — R053 Chronic cough: Secondary | ICD-10-CM

## 2014-04-09 DIAGNOSIS — J3089 Other allergic rhinitis: Secondary | ICD-10-CM

## 2014-04-09 DIAGNOSIS — R05 Cough: Secondary | ICD-10-CM

## 2014-04-09 NOTE — Patient Instructions (Addendum)
Order- lab- Allergy profile     Dx seasonal and perennial allergic rhinitis  We may want to consider limited CT scan of sinuses, methacholine Inhalation Challenge

## 2014-04-09 NOTE — Progress Notes (Signed)
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 Pt reports having cough since 2007 - will be increased x 1-2 months when flares up. He denies history otherwise of food allergy, urticaria or asthma.  He associates cough with intervals of retro-orbital pressure and sinus congestion. Symptoms are perennial, made worse with viral head colds, exposure to cold temperatures and cold drinks. He was previously evaluated at this office by Dr. Lamonte Sakai for chronic cough with impression that his known reflux was the problem. He doesn't see that relationship. Reflux symptoms are controlled on PPIs. No history of intolerance to aspirin. Son is skin test positive for ragweed and grass. Father may have developed rhinitis symptoms in older age. Bananas cause uncomfortable feelings in his tongue CXR 02/06/14 IMPRESSION: No active cardiopulmonary disease. Electronically Signed  By: Lajean Manes M.D.  On: 02/06/2014 11:49  Prior to Admission medications   Medication Sig Start Date End Date Taking? Authorizing Provider  loratadine (CLARITIN) 10 MG tablet Take 10 mg by mouth daily.   Yes Historical Provider, MD  omeprazole (PRILOSEC) 20 MG capsule Take 1 capsule (20 mg total) by mouth daily. 10/23/13  Yes Denita Lung, MD  tadalafil (CIALIS) 5 MG tablet Take 1 tablet (5 mg total) by mouth daily as needed for erectile dysfunction. 05/09/13  Yes Denita Lung, MD   Past Medical History  Diagnosis Date  . Allergy   . GERD (gastroesophageal reflux disease)   . Hypogonadism male   . Erectile dysfunction   . ADD (attention deficit disorder)    Past Surgical History  Procedure Laterality Date  . Vasectomy    . Appendectomy  12/10/2003   Family History  Problem Relation Age of Onset  . Hypertension Mother   . Crohn's disease Father   . Cancer Father 38    colon cancer  . Diabetes Father   . Hypertension Sister   . COPD Neg Hx    History   Social  History  . Marital Status: Single    Spouse Name: N/A    Number of Children: N/A  . Years of Education: N/A   Occupational History  . commercial diver    Social History Main Topics  . Smoking status: Never Smoker   . Smokeless tobacco: Never Used  . Alcohol Use: Yes     Comment: 1-2 beers once a month  . Drug Use: No  . Sexual Activity: Not on file   Other Topics Concern  . Not on file   Social History Narrative   ROS-see HPI Constitutional:   No-   weight loss, night sweats, fevers, chills, fatigue, lassitude. HEENT:   No-  headaches, difficulty swallowing, tooth/dental problems, sore throat,       No-  sneezing, itching, ear ache, nasal congestion, post nasal drip,  CV:  No-   chest pain, orthopnea, PND, swelling in lower extremities, anasarca,                                  dizziness, palpitations Resp: No-   shortness of breath with exertion or at rest.              No-   productive cough,  +non-productive cough,  No- coughing up of blood.              No-   change in color of mucus.  No- wheezing.  Skin: No-   rash or lesions. GI:  No-   heartburn, indigestion, abdominal pain, nausea, vomiting, diarrhea,                 change in bowel habits, loss of appetite GU: No-   dysuria, change in color of urine, no urgency or frequency.  No- flank pain. MS:  No-   joint pain or swelling.  No- decreased range of motion.  No- back pain. Neuro-     nothing unusual Psych:  No- change in mood or affect. No depression or anxiety.  No memory loss.  OBJ- Physical Exam  tall healthy-appearing man General- Alert, Oriented, Affect-appropriate, Distress- none acute Skin- rash-none, lesions- none, excoriation- none Lymphadenopathy- none Head- atraumatic            Eyes- Gross vision intact, PERRLA, conjunctivae and secretions clear            Ears- Hearing, canals-normal            Nose- Clear, no-Septal dev, mucus, polyps, erosion, perforation             Throat- Mallampati III ,  mucosa clear , drainage- none, tonsils- atrophic Neck- flexible , trachea midline, no stridor , thyroid nl, carotid no bruit Chest - symmetrical excursion , unlabored           Heart/CV- RRR , no murmur , no gallop  , no rub, nl s1 s2                           - JVD- none , edema- none, stasis changes- none, varices- none           Lung- clear to P&A, wheeze- none, cough+dry , dullness-none, rub- none           Chest wall-  Abd- tender-no, distended-no, bowel sounds-present, HSM- no Br/ Gen/ Rectal- Not done, not indicated Extrem- cyanosis- none, clubbing, none, atrophy- none, strength- nl Neuro- grossly intact to observation

## 2014-04-10 LAB — ALLERGY FULL PROFILE
Allergen, D pternoyssinus,d7: <0.1 kU/L
Allergen,Goose feathers, e70: <0.1 kU/L
Aspergillus fumigatus, m3: <0.1 kU/L
Bahia Grass: <0.1 kU/L
Box Elder IgE: <0.1 kU/L
Cat Dander: <0.1 kU/L
Common Ragweed: <0.1 kU/L
D. farinae: <0.1 kU/L
Elm IgE: <0.1 kU/L
G005 Rye, Perennial: <0.1 kU/L
Goldenrod: <0.1 kU/L
Helminthosporium halodes: <0.1 kU/L
House Dust Hollister: <0.1 kU/L
IGE (IMMUNOGLOBULIN E), SERUM: 38 kU/L (ref <115)
Stemphylium Botryosum: <0.1 kU/L
Sycamore Tree: <0.1 kU/L

## 2014-04-10 NOTE — Assessment & Plan Note (Signed)
Question if cough reflects chronic sinus disease/allergy. He is working with ENT/Dr. Ernesto Rutherford can decide if a CT scan of sinuses or similar evaluation is appropriate. Plan-allergy profile, discussion of environmental exposures. Consider skin testing later if needed.

## 2014-05-31 ENCOUNTER — Telehealth: Payer: Self-pay | Admitting: Internal Medicine

## 2014-05-31 NOTE — Telephone Encounter (Signed)
I can certainly look and see if it's going to be easy to remove but we don't always remove fragments. Most of the time we leave them in the lesser causing difficulty
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
## 2014-05-31 NOTE — Telephone Encounter (Signed)
Patient said he would call back and make appointment

## 2014-06-07 ENCOUNTER — Telehealth: Payer: Self-pay | Admitting: Internal Medicine

## 2014-06-07 NOTE — Telephone Encounter (Signed)
Rec'd from Dr.Crossby forward 2 pages to [facility]

## 2014-06-08 ENCOUNTER — Ambulatory Visit: Payer: No Typology Code available for payment source | Admitting: Internal Medicine

## 2015-04-09 ENCOUNTER — Telehealth: Payer: Self-pay

## 2015-04-09 ENCOUNTER — Encounter: Payer: Self-pay | Admitting: Internal Medicine

## 2015-04-09 NOTE — Telephone Encounter (Signed)
Pt needs colonoscopy referral. He falls under the high risk category, and didn't get one at age 50 like he was supposed to.

## 2015-04-10 NOTE — Telephone Encounter (Signed)
His father has Crohn's disease and colon ca at age 48. Recommend colonoscopy at age 63 however I will give stool cards

## 2015-05-20 ENCOUNTER — Ambulatory Visit: Payer: No Typology Code available for payment source | Admitting: Internal Medicine

## 2015-08-05 ENCOUNTER — Other Ambulatory Visit (INDEPENDENT_AMBULATORY_CARE_PROVIDER_SITE_OTHER): Payer: No Typology Code available for payment source

## 2015-08-05 DIAGNOSIS — K50919 Crohn's disease, unspecified, with unspecified complications: Secondary | ICD-10-CM

## 2015-08-05 LAB — HEMOCCULT GUIAC POC 1CARD (OFFICE)
Card #2 Fecal Occult Blod, POC: NEGATIVE
Card #3 Fecal Occult Blood, POC: NEGATIVE
Fecal Occult Blood, POC: NEGATIVE

## 2015-08-22 ENCOUNTER — Ambulatory Visit (INDEPENDENT_AMBULATORY_CARE_PROVIDER_SITE_OTHER): Payer: No Typology Code available for payment source | Admitting: Family Medicine

## 2015-08-22 ENCOUNTER — Encounter: Payer: Self-pay | Admitting: Family Medicine

## 2015-08-22 ENCOUNTER — Encounter: Payer: Self-pay | Admitting: Medical

## 2015-08-22 ENCOUNTER — Ambulatory Visit (INDEPENDENT_AMBULATORY_CARE_PROVIDER_SITE_OTHER): Payer: Self-pay | Admitting: Medical

## 2015-08-22 VITALS — BP 130/90 | HR 86 | Ht 72.5 in | Wt 218.6 lb

## 2015-08-22 VITALS — BP 130/90 | HR 86 | Ht 72.5 in | Wt 218.0 lb

## 2015-08-22 DIAGNOSIS — E291 Testicular hypofunction: Secondary | ICD-10-CM

## 2015-08-22 DIAGNOSIS — Z0289 Encounter for other administrative examinations: Secondary | ICD-10-CM

## 2015-08-22 DIAGNOSIS — K219 Gastro-esophageal reflux disease without esophagitis: Secondary | ICD-10-CM

## 2015-08-22 DIAGNOSIS — J3089 Other allergic rhinitis: Secondary | ICD-10-CM | POA: Diagnosis not present

## 2015-08-22 DIAGNOSIS — I10 Essential (primary) hypertension: Secondary | ICD-10-CM

## 2015-08-22 DIAGNOSIS — Z800 Family history of malignant neoplasm of digestive organs: Secondary | ICD-10-CM | POA: Diagnosis not present

## 2015-08-22 DIAGNOSIS — N529 Male erectile dysfunction, unspecified: Secondary | ICD-10-CM | POA: Insufficient documentation

## 2015-08-22 DIAGNOSIS — Z0000 Encounter for general adult medical examination without abnormal findings: Secondary | ICD-10-CM | POA: Diagnosis not present

## 2015-08-22 LAB — COMPREHENSIVE METABOLIC PANEL
ALT: 23 U/L (ref 9–46)
AST: 18 U/L (ref 10–40)
Albumin: 4.8 g/dL (ref 3.6–5.1)
Alkaline Phosphatase: 48 U/L (ref 40–115)
BILIRUBIN TOTAL: 0.9 mg/dL (ref 0.2–1.2)
BUN: 20 mg/dL (ref 7–25)
CO2: 26 mmol/L (ref 20–31)
Calcium: 9.7 mg/dL (ref 8.6–10.3)
Chloride: 101 mmol/L (ref 98–110)
Creat: 1.15 mg/dL (ref 0.60–1.35)
Glucose, Bld: 98 mg/dL (ref 65–99)
Potassium: 4.3 mmol/L (ref 3.5–5.3)
SODIUM: 137 mmol/L (ref 135–146)
TOTAL PROTEIN: 7.4 g/dL (ref 6.1–8.1)

## 2015-08-22 LAB — POCT URINALYSIS DIPSTICK
Bilirubin, UA: NEGATIVE
Blood, UA: NEGATIVE
Glucose, UA: NEGATIVE
Ketones, UA: NEGATIVE
LEUKOCYTES UA: NEGATIVE
NITRITE UA: NEGATIVE
PH UA: 6
PROTEIN UA: NEGATIVE
Spec Grav, UA: 1.025
Urobilinogen, UA: NEGATIVE

## 2015-08-22 LAB — CBC WITH DIFFERENTIAL/PLATELET
BASOS PCT: 0 %
Basophils Absolute: 0 cells/uL (ref 0–200)
EOS PCT: 2 %
Eosinophils Absolute: 166 cells/uL (ref 15–500)
HCT: 47.3 % (ref 38.5–50.0)
HEMOGLOBIN: 16.5 g/dL (ref 13.2–17.1)
Lymphocytes Relative: 20 %
Lymphs Abs: 1660 cells/uL (ref 850–3900)
MCH: 27.8 pg (ref 27.0–33.0)
MCHC: 34.9 g/dL (ref 32.0–36.0)
MCV: 79.8 fL — ABNORMAL LOW (ref 80.0–100.0)
MPV: 8.4 fL (ref 7.5–12.5)
Monocytes Absolute: 415 cells/uL (ref 200–950)
Monocytes Relative: 5 %
Neutro Abs: 6059 cells/uL (ref 1500–7800)
Neutrophils Relative %: 73 %
Platelets: 264 10*3/uL (ref 140–400)
RBC: 5.93 MIL/uL — AB (ref 4.20–5.80)
RDW: 13.9 % (ref 11.0–15.0)
WBC: 8.3 10*3/uL (ref 4.0–10.5)

## 2015-08-22 LAB — LIPID PANEL
Cholesterol: 212 mg/dL — ABNORMAL HIGH (ref 125–200)
HDL: 40 mg/dL (ref >=40)
LDL CALC: 148 mg/dL — AB (ref <130)
TRIGLYCERIDES: 120 mg/dL (ref <150)
Total CHOL/HDL Ratio: 5.3 Ratio — ABNORMAL HIGH (ref <=5.0)
VLDL: 24 mg/dL (ref <30)

## 2015-08-22 MED ORDER — RANITIDINE HCL 150 MG PO TABS: 150.0000 mg | ORAL_TABLET | Freq: Two times a day (BID) | ORAL | Status: DC

## 2015-08-22 NOTE — Progress Notes (Signed)
Subjective:    Patient ID: Craig Byrd, male    DOB: 1965-07-01, 50 y.o.   MRN: [id number]  HPI  he is here for complete examination. He presently he is being evaluated by ENT and plans to go to allergy. He is having difficulty with a cough. He is taking Levaquin. He will be seeing allergy in the future to evaluate his allergies as being a factor in his coughing. He does have a history of hypogonadism and presently is taking a compounded medication consisting of testosterone 100 mg and DHEA 75 mg 5 pumps per day. His most recent blood work was on 4 pumps per day and he was in the 400 range. He also has underlying erectile dysfunction and is using Cialis. He has tried other medications but found them to be less useful and having more side effects. He is having a troche compounded for this. He also has reflux disease and has been on Prilosec however his wife who is a pharmacist would like him to stop this. Family and social history as well as health maintenance and immunizations were reviewed. His father was diagnosed with colon cancer at age 15   Review of Systems  All other systems reviewed and are negative.      Objective:   Physical Exam BP 130/90 mmHg  Pulse 86  Ht 6' 0.5" (1.842 m)  Wt 218 lb 9.6 oz (99.156 kg)  BMI 29.22 kg/m2  SpO2 96%  General Appearance:    Alert, cooperative, no distress, appears stated age  Head:    Normocephalic, without obvious abnormality, atraumatic  Eyes:    PERRL, conjunctiva/corneas clear, EOM's intact, fundi    benign  Ears:    Normal TM's and external ear canals  Nose:   Nares normal, mucosa normal, no drainage or sinus   tenderness  Throat:   Lips, mucosa, and tongue normal; teeth and gums normal  Neck:   Supple, no lymphadenopathy;  thyroid:  no   enlargement/tenderness/nodules; no carotid   bruit or JVD  Back:    Spine nontender, no curvature, ROM normal, no CVA     tenderness  Lungs:     Clear to auscultation bilaterally without wheezes,  rales or     ronchi; respirations unlabored  Chest Wall:    No tenderness or deformity   Heart:    Regular rate and rhythm, S1 and S2 normal, no murmur, rub   or gallop  Breast Exam:    No chest wall tenderness, masses or gynecomastia  Abdomen:     Soft, non-tender, nondistended, normoactive bowel sounds,    no masses, no hepatosplenomegaly  Genitalia:    Normal male external genitalia without lesions.  Testicles without masses.  No inguinal hernias.  Rectal:    Normal sphincter tone, no masses or tenderness; guaiac negative stool.  Prostate smooth, no nodules, not enlarged.  Extremities:   No clubbing, cyanosis or edema  Pulses:   2+ and symmetric all extremities  Skin:   Skin color, texture, turgor normal, no rashes or lesions  Lymph nodes:   Cervical, supraclavicular, and axillary nodes normal  Neurologic:   CNII-XII intact, normal strength, sensation and gait; reflexes 2+ and symmetric throughout          Psych:   Normal mood, affect, hygiene and grooming.          Assessment & Plan:  Routine general medical examination at a health care facility - Plan: POCT Urinalysis Dipstick, Visual acuity screening, CBC with  Differential/Platelet, Comprehensive metabolic panel, Lipid panel  Family history of colon cancer  Erectile dysfunction, unspecified erectile dysfunction type  Hypogonadism in male - Plan: PSA, Testosterone  Gastroesophageal reflux disease, esophagitis presence not specified - Plan: ranitidine (ZANTAC) 150 MG tablet  Other allergic rhinitis  he will switch to Zantac twice a day and then go down to the lowest dose possible for relief of symptoms. Also discussed the use of the compounded formulation. I will call this in when he needs it again. Recommend that they check online for a [place] to get Cialis to see if this will help with reducing the cost and if not I will write for the troche

## 2015-08-22 NOTE — Progress Notes (Signed)
Commercial Driver Medical Examination   Craig Byrd is a 50 y.o. male who presents today for a commercial driver fitness determination physical exam.  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    He works as a Office manager.   Last DOT physical 07/07/2012.  Medical care team includes:  Dr. Redmond School here for primary care  The patient reports no problems.  Review of Systems A comprehensive review of systems was reviewed and noted as below:  Eye: - corrective lenses, -lasik surgery or other eye surgery, -glaucoma, -cataracts, -macular degeneration, -monocular vision, -medication for eye condition, -blurred vision,   Ears: -hearing problems, - hearing aids, -ear pain, -ear drainage, -ear fullness, -tinnitus, -recurrent ear infection, -previous ear surgery, - vertigo, -meniere's disease  Endocrine: -polydipsia, -polyuria, -weight loss, -fainting, -dizziness, - altered or loss of consciousness, -hypoglycemia  Cardiovascular: -heart disease, -CHF, -heart attack, -cardiac stents, -bypass surgery, -other heart surgery, -hypertension, -blood clots, -pacemaker, -medications for heart condition, -chest pain, -SOB, -palpitations, -fainting, -dizziness, -dyspnea  Respiratory: -asthma, -COPD, other lung disease, -smoker, -chest tightness, - wheezing, -snoring, -daytime sleepiness, -sleep apnea or uses CPAP, -narcolepsy, -sleeping disorder  Allergy: -uncontrollable sneezing or allergy symptoms  Musculoskeletal: -missing body parts, -muscle disease, -bone disease, -spine injury, -low back pain, -medication for joints, bones, muscles or pain, -physical limitations, -joint pain, -neck pain, -limitations of neck ROM, -back surgery, orthopedic surgery, -rheumatologic condition, -gout  Neurologic: -neurologic disease, -dementia, -seizures, -parkinson's, -tremor, -memory problems, -weakness, -numbness, -tingling, -medication for neurologic condition,  -medications for sleep condition  Gastric: -abdominal pain, -chronic diarrhea or IBS, -uncontrollable nausea  Kidney/Renal: -hematuria, -dialysis, kidney disease, polycystic kidney disease  Psychiatric: -homicidal thoughts, -suicidal thoughts, -prior suicide attempts, -get into fights/hurting others, -memory or concentration problems, -delusions, -hallucinations, -hospitalization for mental health problem, -depression, -anxiety, -bipolar  Drug use: - none  Reviewed their medical, surgical, family, social, medication, and allergy history and updated chart as appropriate.     Objective:   Physical Exam  BP 130/90 mmHg  Pulse 86  Ht 6' 0.5" (1.842 m)  Wt 218 lb (98.884 kg)  BMI 29.14 kg/m2  General appearance: alert, no distress, WD/WN, white male Skin: scattered macules HEENT: normocephalic, conjunctiva/corneas normal, sclerae anicteric, PERRLA, EOMi, nares patent, no discharge or erythema, pharynx normal Oral cavity: MMM, tongue normal, teeth normal Neck: supple, no lymphadenopathy, no thyromegaly, no masses, normal ROM, no bruits Chest: non tender, normal shape and expansion Heart: RRR, normal S1, S2, no murmurs Lungs: CTA bilaterally, no wheezes, rhonchi, or rales Abdomen: +bs, soft, non tender, non distended, no masses, no hepatomegaly, no splenomegaly, no bruits Back: non tender, normal ROM, no scoliosis Musculoskeletal: upper extremities non tender, no obvious deformity, normal ROM throughout, lower extremities non tender, no obvious deformity, normal ROM throughout Extremities: no edema, no cyanosis, no clubbing Pulses: 2+ symmetric, upper and lower extremities, normal cap refill Neurological: alert, oriented x 3, CN2-12 intact, strength normal upper extremities and lower extremities, sensation normal throughout, DTRs 2+ throughout, no cerebellar signs, gait normal Psychiatric: normal affect, behavior normal, pleasant  GU: normal male external genitalia, nontender, no  masses, no hernia, no lymphadenopathy Rectal: deferred    Assessment:   Encounter Diagnoses  Name Primary?  . Health examination of defined subpopulation Yes  . Essential hypertension     Plan:    Medical examiners certificate completed and printed. Need follow-up in 3 month for recheck of bp.  New diagnosis stage 1 hypertension.  Gave 1 year certificate, advised f/u with Dr. Redmond School within 3 mo after trial of lifestyle changes, discussed diagnosis, possible complications.  He does monitor BP at home.

## 2015-08-23 LAB — TESTOSTERONE: Testosterone: 524 ng/dL (ref 250–827)

## 2015-08-23 LAB — PSA: PSA: 0.91 ng/mL (ref <=4.00)

## 2015-10-23 ENCOUNTER — Telehealth: Payer: Self-pay | Admitting: Family Medicine

## 2015-10-23 NOTE — Telephone Encounter (Signed)
Pt called and states that he needs a refill on his testosterone 100 mg DHEA 75 mg states you and him talked about this at his last visit. Pt would like [facility] to [facility] at [place], [place], [place] [address]  pt can be reached at [phone] (M) infmored him that you was out of the office today.

## 2015-10-23 NOTE — Telephone Encounter (Signed)
Call this in 

## 2015-10-24 ENCOUNTER — Other Ambulatory Visit: Payer: Self-pay

## 2015-10-24 NOTE — Telephone Encounter (Signed)
[facility]ed [facility]

## 2015-11-01 ENCOUNTER — Telehealth: Payer: Self-pay | Admitting: Family Medicine

## 2015-11-01 NOTE — Telephone Encounter (Signed)
Pt's wife, Amy, called stating that when pt's testosterone med was recently called in for him to [place] directions states to take 100 mg but wife said it was discussed with pt at last visit or after last visit that he was to take 125 mg. Wife said pt plan to take 125 mg as instructed but will run out of meds so will need refill sent in sooner and then will need script changed at pharmacy next time

## 2015-11-01 NOTE — Telephone Encounter (Signed)
Please straighten this out

## 2015-11-04 ENCOUNTER — Other Ambulatory Visit: Payer: Self-pay

## 2015-11-04 MED ORDER — NONFORMULARY OR COMPOUNDED ITEM: 0 refills | Status: DC

## 2015-11-04 NOTE — Telephone Encounter (Signed)
I have called [facility] and left message that a change to pt med from 100 mg to125 mg to hold med till refill was needed

## 2015-12-25 ENCOUNTER — Telehealth: Payer: Self-pay

## 2015-12-25 ENCOUNTER — Telehealth: Payer: Self-pay | Admitting: Family Medicine

## 2015-12-25 ENCOUNTER — Other Ambulatory Visit: Payer: Self-pay | Admitting: Medical

## 2015-12-25 MED ORDER — OMEPRAZOLE 20 MG PO CPDR: 20.0000 mg | DELAYED_RELEASE_CAPSULE | Freq: Every day | ORAL | 0 refills | Status: DC

## 2015-12-25 MED ORDER — OMEPRAZOLE 40 MG PO CPDR: 40.0000 mg | DELAYED_RELEASE_CAPSULE | Freq: Every day | ORAL | 0 refills | Status: DC

## 2015-12-25 NOTE — Telephone Encounter (Signed)
Spoke with pt- he was made aware. He states that he has been using omeprazole OTC  and it is working well. Prescriptions help with cost.

## 2015-12-25 NOTE — Telephone Encounter (Signed)
I sent omeprazole, have him f/u in 3-4 wk on this.

## 2015-12-25 NOTE — Telephone Encounter (Signed)
Omeprazole 20mg  [facility]

## 2015-12-25 NOTE — Telephone Encounter (Signed)
Fax rcvd from office requesting refill of omeprazole. However pt has been using Zantac with no relief in sx. He would like to restart omeprazole 20mg  and stop takign Zantac. Is this ok to renew to [facility].

## 2015-12-25 NOTE — Telephone Encounter (Signed)
Rcvd note from pharmacy states that pt has previously been on Omeprazole 20mg  but we sent in a script for 40mg  & pt does not want 40mg . Requesting [place] to resend Omeprazole script as 20mg .

## 2016-04-08 ENCOUNTER — Telehealth: Payer: Self-pay | Admitting: Family Medicine

## 2016-04-08 MED ORDER — NONFORMULARY OR COMPOUNDED ITEM: 0 refills | Status: DC

## 2016-04-08 NOTE — Telephone Encounter (Signed)
Requesting refill on Testosterone. If any questions, can speak to pt's wife, Amy, who is a Software engineer at [employer] where med should be called in.

## 2016-04-08 NOTE — Telephone Encounter (Signed)
He needs to come in for medication check but don't let them run out of the testosterone

## 2016-04-08 NOTE — Telephone Encounter (Signed)
Called and gave verbal to Amy- pharmacist and she was made aware to renew script for 30 days at current dose filled by [facility] of testosterone liboderm 20% 200mg  daily. She is aware pt needs appt for further refill.  Pt is also aware need for appt. Victorino December

## 2016-06-15 ENCOUNTER — Telehealth: Payer: Self-pay | Admitting: Family Medicine

## 2016-06-15 DIAGNOSIS — N529 Male erectile dysfunction, unspecified: Secondary | ICD-10-CM

## 2016-06-15 DIAGNOSIS — Z800 Family history of malignant neoplasm of digestive organs: Secondary | ICD-10-CM

## 2016-06-15 DIAGNOSIS — J3089 Other allergic rhinitis: Secondary | ICD-10-CM

## 2016-06-15 DIAGNOSIS — E291 Testicular hypofunction: Secondary | ICD-10-CM

## 2016-06-15 DIAGNOSIS — E785 Hyperlipidemia, unspecified: Secondary | ICD-10-CM

## 2016-06-15 NOTE — Telephone Encounter (Signed)
Set him up for regular appointment

## 2016-06-15 NOTE — Telephone Encounter (Signed)
Pt called and stated he was to come in for a testosterone lab check. Labs are not in system. Please place orders if ok and advise pt so he will know when to come in. Pt can be reached at [phone].

## 2016-06-16 ENCOUNTER — Other Ambulatory Visit: Payer: No Typology Code available for payment source

## 2016-06-16 DIAGNOSIS — E291 Testicular hypofunction: Secondary | ICD-10-CM

## 2016-06-16 DIAGNOSIS — Z800 Family history of malignant neoplasm of digestive organs: Secondary | ICD-10-CM

## 2016-06-16 DIAGNOSIS — E785 Hyperlipidemia, unspecified: Secondary | ICD-10-CM

## 2016-06-16 LAB — CBC WITH DIFFERENTIAL/PLATELET
Basophils Absolute: 63 cells/uL (ref 0–200)
Basophils Relative: 1 %
EOS ABS: 189 {cells}/uL (ref 15–500)
Eosinophils Relative: 3 %
HCT: 48.5 % (ref 38.5–50.0)
Hemoglobin: 16.5 g/dL (ref 13.2–17.1)
Lymphocytes Relative: 29 %
Lymphs Abs: 1827 cells/uL (ref 850–3900)
MCH: 27.7 pg (ref 27.0–33.0)
MCHC: 34 g/dL (ref 32.0–36.0)
MCV: 81.4 fL (ref 80.0–100.0)
MPV: 8.3 fL (ref 7.5–12.5)
Monocytes Absolute: 504 cells/uL (ref 200–950)
Monocytes Relative: 8 %
Neutro Abs: 3717 cells/uL (ref 1500–7800)
Neutrophils Relative %: 59 %
Platelets: 245 10*3/uL (ref 140–400)
RBC: 5.96 MIL/uL — AB (ref 4.20–5.80)
RDW: 14.1 % (ref 11.0–15.0)
WBC: 6.3 10*3/uL (ref 4.0–10.5)

## 2016-06-16 LAB — COMPREHENSIVE METABOLIC PANEL
ALT: 23 U/L (ref 9–46)
AST: 18 U/L (ref 10–40)
Albumin: 4.5 g/dL (ref 3.6–5.1)
Alkaline Phosphatase: 45 U/L (ref 40–115)
BUN: 15 mg/dL (ref 7–25)
CO2: 27 mmol/L (ref 20–31)
Calcium: 9.2 mg/dL (ref 8.6–10.3)
Chloride: 103 mmol/L (ref 98–110)
Creat: 1.2 mg/dL (ref 0.60–1.35)
Glucose, Bld: 91 mg/dL (ref 65–99)
POTASSIUM: 4.3 mmol/L (ref 3.5–5.3)
Sodium: 138 mmol/L (ref 135–146)
Total Bilirubin: 0.6 mg/dL (ref 0.2–1.2)
Total Protein: 6.9 g/dL (ref 6.1–8.1)

## 2016-06-16 LAB — LIPID PANEL
CHOL/HDL RATIO: 5.6 ratio — AB (ref <5.0)
Cholesterol: 191 mg/dL (ref <200)
HDL: 34 mg/dL — AB (ref >40)
LDL CALC: 132 mg/dL — AB (ref <100)
TRIGLYCERIDES: 127 mg/dL (ref <150)
VLDL: 25 mg/dL (ref <30)

## 2016-06-16 LAB — PSA: PSA: 1 ng/mL (ref <=4.0)

## 2016-06-16 NOTE — Telephone Encounter (Signed)
Dr.lalonde patient would like to have labs drawn then come in for an appointment please place orders and I will get him an appointment

## 2016-06-16 NOTE — Telephone Encounter (Signed)
Pt has set up lab and he will set up appointment when here

## 2016-06-16 NOTE — Telephone Encounter (Signed)
I put the orders in. Schedule him for half an hour visit today has been about a year

## 2016-06-17 LAB — TESTOSTERONE: TESTOSTERONE: 720 ng/dL (ref 250–827)

## 2016-07-24 ENCOUNTER — Encounter: Payer: No Typology Code available for payment source | Admitting: Family Medicine

## 2016-08-03 ENCOUNTER — Ambulatory Visit (INDEPENDENT_AMBULATORY_CARE_PROVIDER_SITE_OTHER): Payer: No Typology Code available for payment source | Admitting: Family Medicine

## 2016-08-03 ENCOUNTER — Encounter: Payer: Self-pay | Admitting: Family Medicine

## 2016-08-03 ENCOUNTER — Other Ambulatory Visit: Payer: Self-pay

## 2016-08-03 VITALS — BP 130/90 | HR 78 | Ht 73.0 in | Wt 228.0 lb

## 2016-08-03 DIAGNOSIS — E291 Testicular hypofunction: Secondary | ICD-10-CM | POA: Diagnosis not present

## 2016-08-03 DIAGNOSIS — I10 Essential (primary) hypertension: Secondary | ICD-10-CM

## 2016-08-03 DIAGNOSIS — N529 Male erectile dysfunction, unspecified: Secondary | ICD-10-CM | POA: Diagnosis not present

## 2016-08-03 MED ORDER — LISINOPRIL-HYDROCHLOROTHIAZIDE 10-12.5 MG PO TABS: 1.0000 | ORAL_TABLET | Freq: Every day | ORAL | 1 refills | Status: DC

## 2016-08-03 NOTE — Progress Notes (Signed)
   Subjective:    Patient ID: Craig Byrd, male    DOB: 1965/08/24, 51 y.o.   MRN: [id number]  HPI He is here for consult concerning multiple issues. He is presently on compounded testosterone. His wife who is a Software engineer did some saliva testing and states his DHEA is low and wanted that added to the regimen. Also in the past he had been getting tadalafil in 40 mg troche that was compounded. He usually would take half of that as needed for his ED. He would like a refill on this. He also brought in blood pressure readings over the last year which do show evidence of hypertension.   Review of Systems     Objective:   Physical Exam alert and in no distress. Blood pressure is recorded. He did have blood work done in March which did show normal testosterone levels.      Assessment & Plan:  Hypogonadism male  Erectile dysfunction, unspecified erectile dysfunction type  Essential hypertension - Plan: lisinopril-hydrochlorothiazide (PRINZIDE,ZESTORETIC) 10-12.5 MG tablet I explained that I did not feel comfortable nor have I ever added DHEA to a regimen. He will attempt to find someone who is able to do that. I did write a prescription for testosterone to be compounded. We also called in the tadalafil 40 mg troche. I will also place him on lisinopril/HCTZ. Discussed the side effects including dry cough and possible angioedema with him. He will return here in one month for recheck. He does plan to start a diet and exercise program and I explained that potentially down the road we can take him off this medication but not until he proves that he can get the weight off and keep it off.

## 2016-08-20 ENCOUNTER — Other Ambulatory Visit: Payer: Self-pay

## 2016-08-20 MED ORDER — NONFORMULARY OR COMPOUNDED ITEM: 99 refills | Status: DC

## 2016-09-14 ENCOUNTER — Encounter: Payer: Self-pay | Admitting: Family Medicine

## 2016-09-14 ENCOUNTER — Ambulatory Visit (INDEPENDENT_AMBULATORY_CARE_PROVIDER_SITE_OTHER): Payer: No Typology Code available for payment source | Admitting: Family Medicine

## 2016-09-14 VITALS — BP 126/84 | HR 80 | Ht 73.0 in | Wt 232.0 lb

## 2016-09-14 DIAGNOSIS — N529 Male erectile dysfunction, unspecified: Secondary | ICD-10-CM

## 2016-09-14 DIAGNOSIS — I10 Essential (primary) hypertension: Secondary | ICD-10-CM

## 2016-09-14 MED ORDER — CHLORTHALIDONE 25 MG PO TABS: 25.0000 mg | ORAL_TABLET | Freq: Every day | ORAL | 0 refills | Status: DC

## 2016-09-14 NOTE — Progress Notes (Signed)
   Subjective:    Patient ID: Craig Byrd, male    DOB: 10-Sep-1965, 51 y.o.   MRN: [id number]  HPI He is here for a recheck. He recently had difficulty with a sore throat and swollen uvula. He was seen in an urgent care center and placed on an antibiotic as well as steroids. He was told to hold his lisinopril. His wife who is a Software engineer is interested in trying him on a different single-dose medication. He also has underlying ED and is not really sure whether the lisinopril/HCTZ has had much of a effect on his ED.   Review of Systems     Objective:   Physical Exam Alert and in no distress. Blood pressure is recorded.       Assessment & Plan:  Essential hypertension - Plan: chlorthalidone (HYGROTON) 25 MG tablet  Erectile dysfunction, unspecified erectile dysfunction type I discussed the options with him. I am not sure whether he truly had angioedema but we will postpone making that decision at the present time. I will place him on chlorthalidone and see if it controls his blood pressure and what effect it has on EDD. Encouraged him to use Cialis as needed for his ADD. He is to return here in 2 months for recheck.

## 2016-09-15 ENCOUNTER — Other Ambulatory Visit: Payer: No Typology Code available for payment source

## 2016-11-06 ENCOUNTER — Other Ambulatory Visit: Payer: Self-pay | Admitting: Family Medicine

## 2016-11-06 ENCOUNTER — Encounter: Payer: Self-pay | Admitting: Family Medicine

## 2016-11-06 MED ORDER — TADALAFIL 5 MG PO TABS: 5.0000 mg | ORAL_TABLET | Freq: Every day | ORAL | 1 refills | Status: DC

## 2016-11-24 ENCOUNTER — Encounter: Payer: Self-pay | Admitting: Family Medicine

## 2016-12-21 ENCOUNTER — Ambulatory Visit (INDEPENDENT_AMBULATORY_CARE_PROVIDER_SITE_OTHER): Payer: No Typology Code available for payment source | Admitting: Family Medicine

## 2016-12-21 ENCOUNTER — Encounter: Payer: Self-pay | Admitting: Family Medicine

## 2016-12-21 VITALS — BP 140/94 | HR 79 | Wt 228.0 lb

## 2016-12-21 DIAGNOSIS — Z79899 Other long term (current) drug therapy: Secondary | ICD-10-CM | POA: Diagnosis not present

## 2016-12-21 DIAGNOSIS — E291 Testicular hypofunction: Secondary | ICD-10-CM

## 2016-12-21 DIAGNOSIS — I10 Essential (primary) hypertension: Secondary | ICD-10-CM | POA: Diagnosis not present

## 2016-12-21 MED ORDER — CHLORTHALIDONE 50 MG PO TABS: 50.0000 mg | ORAL_TABLET | Freq: Every day | ORAL | 3 refills | Status: DC

## 2016-12-21 NOTE — Progress Notes (Signed)
   Subjective:    Patient ID: Craig Byrd, male    DOB: 29-Oct-1965, 51 y.o.   MRN: [id number]  HPI  He is here for a recheck. He did bring his blood pressure cuff in and there is a discrepancy. His machine is off 5 systolic and 10 diastolic. He continues on the 50 mg of chlorthalidone. He also continues on testosterone. It has been over 6 months since his last testosterone. He has good energy and stamina.   Review of Systems     Objective:   Physical Exam Alert and in no distress. Blood pressure is recorded.       Assessment & Plan:  Essential hypertension - Plan: chlorthalidone (HYGROTON) 50 MG tablet, Basic Metabolic Panel  Hypogonadism male - Plan: PSA, Testosterone  Encounter for long-term (current) use of medications - Plan: PSA, Testosterone, Basic Metabolic Panel Will continue to monitor his blood pressure at home. His lungs his blood pressure stays under 135/90, he will be in a good range based on his discrepancy.

## 2016-12-22 ENCOUNTER — Telehealth: Payer: Self-pay

## 2016-12-22 LAB — BASIC METABOLIC PANEL
BUN: 16 mg/dL (ref 7–25)
CHLORIDE: 96 mmol/L — AB (ref 98–110)
CO2: 30 mmol/L (ref 20–32)
Calcium: 9.9 mg/dL (ref 8.6–10.3)
Creat: 1.24 mg/dL (ref 0.60–1.35)
Glucose, Bld: 106 mg/dL — ABNORMAL HIGH (ref 65–99)
Potassium: 3.7 mmol/L (ref 3.5–5.3)
Sodium: 138 mmol/L (ref 135–146)

## 2016-12-22 LAB — PSA: PSA: 1 ng/mL (ref <=4.0)

## 2016-12-22 LAB — TESTOSTERONE: TESTOSTERONE: 573 ng/dL (ref 250–827)

## 2016-12-22 NOTE — Telephone Encounter (Signed)
The lab is not able to perform an A1C on this pt as a lavendar tube was not ordered.Craig Byrd December

## 2017-04-07 ENCOUNTER — Encounter: Payer: Self-pay | Admitting: Family Medicine

## 2017-04-12 ENCOUNTER — Encounter: Payer: Self-pay | Admitting: Allergy and Immunology

## 2017-04-14 ENCOUNTER — Other Ambulatory Visit: Payer: No Typology Code available for payment source

## 2017-04-20 ENCOUNTER — Other Ambulatory Visit: Payer: No Typology Code available for payment source

## 2017-04-20 ENCOUNTER — Telehealth: Payer: Self-pay | Admitting: Family Medicine

## 2017-04-20 DIAGNOSIS — E291 Testicular hypofunction: Secondary | ICD-10-CM

## 2017-04-20 DIAGNOSIS — I10 Essential (primary) hypertension: Secondary | ICD-10-CM

## 2017-04-20 MED ORDER — OMEPRAZOLE 20 MG PO CPDR: 20.0000 mg | DELAYED_RELEASE_CAPSULE | Freq: Every day | ORAL | 3 refills | Status: DC

## 2017-04-20 MED ORDER — CHLORTHALIDONE 50 MG PO TABS: 50.0000 mg | ORAL_TABLET | Freq: Every day | ORAL | 3 refills | Status: DC

## 2017-04-20 MED ORDER — LORATADINE 10 MG PO TABS: 10.0000 mg | ORAL_TABLET | Freq: Every day | ORAL | 3 refills | Status: DC

## 2017-04-20 MED ORDER — SILDENAFIL CITRATE 20 MG PO TABS: ORAL_TABLET | ORAL | 0 refills | Status: DC

## 2017-04-20 NOTE — Telephone Encounter (Signed)
ALERT DO NOT SEND ED MED TO PHARMACY. PT WANTS HAND WRITTEN RX FOR THAT. Pt came in for labs and stated that the pharmacy where his wife works is going out of business. He can get all meds refills at cost. He is requested all meds EXCEPT ED MED be sent for refills. Pt is requesting Viagra be hand written because he can get 120 for $10 while store is closing.  Please call pt at [phone] when ready.

## 2017-04-21 LAB — TESTOSTERONE: TESTOSTERONE: 268 ng/dL (ref 264–916)

## 2017-04-22 ENCOUNTER — Telehealth: Payer: Self-pay

## 2017-04-22 MED ORDER — NONFORMULARY OR COMPOUNDED ITEM: 0 refills | Status: DC

## 2017-04-22 NOTE — Telephone Encounter (Signed)
[place] Pharmacy says they will get it ready. Craig Byrd

## 2017-04-27 ENCOUNTER — Encounter: Payer: Self-pay | Admitting: Family Medicine

## 2017-04-28 ENCOUNTER — Telehealth: Payer: Self-pay | Admitting: Medical

## 2017-04-28 ENCOUNTER — Other Ambulatory Visit: Payer: Self-pay | Admitting: Medical

## 2017-04-28 MED ORDER — LOSARTAN POTASSIUM-HCTZ 50-12.5 MG PO TABS: 1.0000 | ORAL_TABLET | Freq: Every day | ORAL | 0 refills | Status: DC

## 2017-04-28 NOTE — Telephone Encounter (Signed)
forwarding to you  °

## 2017-04-28 NOTE — Telephone Encounter (Signed)
Since Dr. Redmond School is out of town I reviewed his email reply.  It appears that the chlorthalidone was just refilled on January 15.  Regarding his elevated diastolic blood pressures, if he wants to try another medication to add on I can send something to the pharmacy.  I do not know what all he and Dr. Redmond School have discussed in terms of options for medication, and there was some comment about a prior issue with lisinopril and I need to know about before sending a medication to the pharmacy  If he did not have any real serious reaction to lisinopril and we could try a medicine called losartan or similar.  That seems reasonable.  Let me know?

## 2017-04-28 NOTE — Telephone Encounter (Signed)
Craig Byrd spoke with pt on the phone about this and will call his wife to discuss change his meds.

## 2017-04-29 NOTE — Telephone Encounter (Signed)
I called and spoke to both patient and his wife who is a pharmacist yesterday.  We discussed medication options since his blood pressure not under control.  One issue is that chlorthalidone even with discount card is causing him $70 per month.  He apparently did well on lisinopril/HCT in the past.  At one point this was stopped because he thought it may have been causing uvula swelling, but in hindsight they do not think that was the case.  They want to try ARB class of medication.  We discussed the possibility of angioedema, discussed risk and benefits of medication.  He will begin a trial of losartan HCT, and 1 of the reasons they wanted to go with this was that wife can get a whole year's worth for very cheap.  Of note  Wife the pharmacist that works at the pharmacy states that [employer] [facility] is closing his doors for business tomorrow, and she needs this called in today so she can dispense a year supply.    He will follow-up in a month to see Dr. Redmond School, sooner as needed

## 2017-07-19 ENCOUNTER — Encounter: Payer: Self-pay | Admitting: Allergy and Immunology

## 2017-07-19 ENCOUNTER — Ambulatory Visit (INDEPENDENT_AMBULATORY_CARE_PROVIDER_SITE_OTHER): Payer: Self-pay | Admitting: Allergy and Immunology

## 2017-07-19 VITALS — BP 150/94 | HR 80 | Temp 97.8°F | Resp 20 | Ht 72.2 in | Wt 230.6 lb

## 2017-07-19 DIAGNOSIS — Z8709 Personal history of other diseases of the respiratory system: Secondary | ICD-10-CM

## 2017-07-19 DIAGNOSIS — H1013 Acute atopic conjunctivitis, bilateral: Secondary | ICD-10-CM

## 2017-07-19 DIAGNOSIS — T783XXA Angioneurotic edema, initial encounter: Secondary | ICD-10-CM

## 2017-07-19 DIAGNOSIS — H1010 Acute atopic conjunctivitis, unspecified eye: Secondary | ICD-10-CM | POA: Insufficient documentation

## 2017-07-19 DIAGNOSIS — K219 Gastro-esophageal reflux disease without esophagitis: Secondary | ICD-10-CM

## 2017-07-19 DIAGNOSIS — J3089 Other allergic rhinitis: Secondary | ICD-10-CM

## 2017-07-19 DIAGNOSIS — Z87898 Personal history of other specified conditions: Secondary | ICD-10-CM

## 2017-07-19 DIAGNOSIS — T7840XD Allergy, unspecified, subsequent encounter: Secondary | ICD-10-CM

## 2017-07-19 DIAGNOSIS — T783XXD Angioneurotic edema, subsequent encounter: Secondary | ICD-10-CM

## 2017-07-19 HISTORY — DX: Angioneurotic edema, initial encounter: T78.3XXA

## 2017-07-19 MED ORDER — EPINEPHRINE 0.3 MG/0.3ML IJ SOAJ: 0.3000 mg | Freq: Once | INTRAMUSCULAR | 3 refills | Status: AC

## 2017-07-19 MED ORDER — FLUTICASONE PROPIONATE 50 MCG/ACT NA SUSP: NASAL | 5 refills | Status: DC

## 2017-07-19 MED ORDER — LEVOCETIRIZINE DIHYDROCHLORIDE 5 MG PO TABS: 5.0000 mg | ORAL_TABLET | Freq: Every evening | ORAL | 5 refills | Status: DC

## 2017-07-19 MED ORDER — OLOPATADINE HCL 0.7 % OP SOLN: 1.0000 [drp] | Freq: Every day | OPHTHALMIC | 5 refills | Status: DC | PRN

## 2017-07-19 NOTE — Progress Notes (Signed)
New Patient Note  RE: Craig Byrd MRN: [id number] DOB: 1965/10/02 Date of Office Visit: 07/19/2017  Referring provider: Thornell Sartorius, MD Primary care provider: Denita Lung, MD  Chief Complaint: Sinus Problem (PND, runny nose, congestion, sinus headache); Eye Problem (itchy, watery eyes); Cough; and Gastroesophageal Reflux   History of present illness: Craig Byrd is a 52 y.o. male seen today in consultation requested by Minna Merritts, MD.  He reports that last spring he experienced swelling of the uvula.  He denies concomitant urticaria, cardiopulmonary symptoms, or other GI symptoms.  He was apparently on an ACE inhibitor at that time.  This medication was discontinued, however he again experienced uvula swelling approximately 1 or 2 months ago.  Again, he did not experience concomitant urticaria, cardiopulmonary symptoms, or other GI symptoms.  He experiences nasal congestion, postnasal drainage, sinus pressure, rhinorrhea, nasal pruritus, and ocular pruritus.  These symptoms occur year around but tend to be most frequent and severe in the spring time.  He attempts to control the symptoms with loratadine or diphenhydramine.  He had been prescribed a medicated nasal spray in the past, however is currently not using a nasal spray.  In 2007 2008 he had experienced a severe persistent cough.  The coughing was so severe as to result in fractured ribs.  He underwent extensive evaluation by ENT and pulmonology and the cough gradually resolved.   Assessment and plan: Angioedema Unclear etiology.  Food allergen skin testing was negative today despite a positive histamine control.  The patient is not taking an ACE inhibitor.  There are no concomitant symptoms concerning for anaphylaxis or constitutional symptoms worrisome for an underlying malignancy.  While it uvula angioedema may be caused by acid reflux, to be thorough we will order labs to rule out hereditary angioedema, acquired  angioedema and other potential etiologies.   The following labs have been ordered: Tryptase, C4, C1 esterase inhibitor (quantitative and functional), C1q, CBC, CMP, and galactose-alpha-1,3-galactose IgE level.  The patient will be notified with further recommendations after lab results have returned.  Should symptoms recur, a  journal is to be kept recording any foods eaten, beverages consumed, medications taken, activities performed, and environmental conditions within a 6 hour period prior to the onset of symptoms. For any symptoms concerning for anaphylaxis, epinephrine is to be administered and 911 is to be called immediately.  A prescription has been provided for epinephrine 0.3 mg autoinjector 2 pack along with instructions for its proper administration.  Perennial allergic rhinitis  Aeroallergen avoidance measures have been discussed and provided in written form.  A prescription has been provided for levocetirizine, 5 mg daily as needed.  A prescription has been provided for fluticasone nasal spray, one spray per nostril 1-2 times daily as needed. Proper nasal spray technique has been discussed and demonstrated.  Nasal saline lavage (NeilMed) has been recommended as needed and prior to medicated nasal sprays along with instructions for proper administration.  For thick post nasal drainage, add guaifenesin 1200 mg (Mucinex Maximum Strength)  twice daily as needed with adequate hydration as discussed.  If allergen avoidance measures and medications fail to adequately relieve symptoms, aeroallergen immunotherapy will be considered.  Allergic conjunctivitis  Treatment plan as outlined above for allergic rhinitis.  A prescription has been provided for Pazeo, one drop per eye daily as needed.  If insurance does not cover this medication, over-the-counter Zaditor may be used instead.  I have also recommended eye lubricant drops (i.e., Natural Tears) as needed.  Esophageal  reflux  For now, continue appropriate reflux lifestyle modifications and omeprazole as prescribed.   Meds ordered this encounter  Medications  . levocetirizine (XYZAL) 5 MG tablet    Sig: Take 1 tablet (5 mg total) by mouth every evening.    Dispense:  30 tablet    Refill:  5  . fluticasone (FLONASE) 50 MCG/ACT nasal spray    Sig: Use 1 spray in each nostril 1-2 times daily    Dispense:  16 g    Refill:  5  . EPINEPHrine 0.3 mg/0.3 mL IJ SOAJ injection    Sig: Inject 0.3 mLs (0.3 mg total) into the muscle once for 1 dose.    Dispense:  2 Device    Refill:  3  . Olopatadine HCl (PAZEO) 0.7 % SOLN    Sig: Place 1 drop into both eyes daily as needed.    Dispense:  1 Bottle    Refill:  5    If Pazeo is not covered by insurance, please dispense Zaditor instead.    Diagnostics: Spirometry: FEV1 was 78% predicted and FEV1 ratio was 100%. Epicutaneous testing: Negative despite a positive histamine control. Intradermal testing: Positive to molds, dog epithelia, and cat hair. Food allergen skin testing: Negative despite a positive histamine control.    Physical examination: Blood pressure (!) 150/94, pulse 80, temperature 97.8 F (36.6 C), temperature source Oral, resp. rate 20, height 6' 0.2" (1.834 m), weight 230 lb 9.6 oz (104.6 kg).  General: Alert, interactive, in no acute distress. HEENT: TMs pearly gray, turbinates moderately edematous without discharge, post-pharynx moderately erythematous. Neck: Supple without lymphadenopathy. Lungs: Clear to auscultation without wheezing, rhonchi or rales. CV: Normal S1, S2 without murmurs. Abdomen: Nondistended, nontender. Skin: Warm and dry, without lesions or rashes. Extremities:  No clubbing, cyanosis or edema. Neuro:   Grossly intact.  Review of systems:  Review of systems negative except as noted in HPI / PMHx or noted below: Review of Systems  Constitutional: Negative.   HENT: Negative.   Eyes: Negative.   Respiratory:  Negative.   Cardiovascular: Negative.   Gastrointestinal: Negative.   Genitourinary: Negative.   Musculoskeletal: Negative.   Skin: Negative.   Neurological: Negative.   Endo/Heme/Allergies: Negative.   Psychiatric/Behavioral: Negative.     Past medical history:  Past Medical History:  Diagnosis Date  . ADD (attention deficit disorder)   . Allergy   . Angioedema 07/19/2017  . Erectile dysfunction   . GERD (gastroesophageal reflux disease)   . Hypogonadism male     Past surgical history:  Past Surgical History:  Procedure Laterality Date  . APPENDECTOMY  12/10/2003  . VASECTOMY      Family history: Family History  Problem Relation Age of Onset  . Hypertension Mother   . Crohn's disease Father   . Colon cancer Father 35  . Diabetes Father   . Hypertension Sister   . Heart disease Paternal Uncle   . Pancreatic cancer Paternal Grandmother   . Pancreatic cancer Paternal Grandfather   . COPD Neg Hx     Social history: Social History   Socioeconomic History  . Marital status: Single    Spouse name: Not on file  . Number of children: Not on file  . Years of education: Not on file  . Highest education level: Not on file  Occupational History  . Occupation: Air traffic controller: [employer]  . Financial resource strain: Not on file  .  Food insecurity:    Worry: Not on file    Inability: Not on file  . Transportation needs:    Medical: Not on file    Non-medical: Not on file  Tobacco Use  . Smoking status: Never Smoker  . Smokeless tobacco: Never Used  Substance and Sexual Activity  . Alcohol use: Yes    Comment: 1-2 beers once a month  . Drug use: No  . Sexual activity: Not on file  Lifestyle  . Physical activity:    Days per week: Not on file    Minutes per session: Not on file  . Stress: Not on file  Relationships  . Social connections:    Talks on phone: Not on file    Gets together: Not on file    Attends religious  service: Not on file    Active member of club or organization: Not on file    Attends meetings of clubs or organizations: Not on file    Relationship status: Not on file  . Intimate partner violence:    Fear of current or ex partner: Not on file    Emotionally abused: Not on file    Physically abused: Not on file    Forced sexual activity: Not on file  Other Topics Concern  . Not on file  Social History Narrative  . Not on file   Environmental History: The patient lives in a 50 year old house with carpeting throughout, gas heat, and central air.  There is a dog in the home which has access to his bedroom.  There is no known mold/water damage in the home.  He is a non-smoker.  Allergies as of 07/19/2017   No Known Allergies     Medication List        Accurate as of 07/19/17 11:59 PM. Always use your most recent med list.          EPINEPHrine 0.3 mg/0.3 mL Soaj injection Commonly known as:  EPI-PEN Inject 0.3 mLs (0.3 mg total) into the muscle once for 1 dose.   fluticasone 50 MCG/ACT nasal spray Commonly known as:  FLONASE Use 1 spray in each nostril 1-2 times daily   levocetirizine 5 MG tablet Commonly known as:  XYZAL Take 1 tablet (5 mg total) by mouth every evening.   loratadine 10 MG tablet Commonly known as:  CLARITIN Take 1 tablet (10 mg total) by mouth daily.   losartan-hydrochlorothiazide 50-12.5 MG tablet Commonly known as:  HYZAAR Take 1 tablet by mouth daily.   NONFORMULARY OR COMPOUNDED ITEM Tadalafil 40mg  troche 1/2/ tab as needed and 10 tabs so 20 doses   NONFORMULARY OR COMPOUNDED ITEM Testosterone liboderm 20% apply 200mg  per day (last dose filled by [facility]).   Olopatadine HCl 0.7 % Soln Commonly known as:  PAZEO Place 1 drop into both eyes daily as needed.   omeprazole 20 MG capsule Commonly known as:  PRILOSEC Take 1 capsule (20 mg total) by mouth daily.   sildenafil 20 MG tablet Commonly known as:  REVATIO Take up to 5  pills daily as needed   tadalafil 5 MG tablet Commonly known as:  CIALIS Take 1 tablet (5 mg total) by mouth daily.       Known medication allergies: No Known Allergies  I appreciate the opportunity to take part in Jayceion's care. Please do not hesitate to contact me with questions.  Sincerely,   R. Edgar Frisk, MD

## 2017-07-19 NOTE — Assessment & Plan Note (Signed)
   For now, continue appropriate reflux lifestyle modifications and omeprazole as prescribed.

## 2017-07-19 NOTE — Assessment & Plan Note (Addendum)
Unclear etiology.  Food allergen skin testing was negative today despite a positive histamine control.  The patient is not taking an ACE inhibitor.  There are no concomitant symptoms concerning for anaphylaxis or constitutional symptoms worrisome for an underlying malignancy.  While it uvula angioedema may be caused by acid reflux, to be thorough we will order labs to rule out hereditary angioedema, acquired angioedema and other potential etiologies.   The following labs have been ordered: Tryptase, C4, C1 esterase inhibitor (quantitative and functional), C1q, CBC, CMP, and galactose-alpha-1,3-galactose IgE level.  The patient will be notified with further recommendations after lab results have returned.  Should symptoms recur, a  journal is to be kept recording any foods eaten, beverages consumed, medications taken, activities performed, and environmental conditions within a 6 hour period prior to the onset of symptoms. For any symptoms concerning for anaphylaxis, epinephrine is to be administered and 911 is to be called immediately.  A prescription has been provided for epinephrine 0.3 mg autoinjector 2 pack along with instructions for its proper administration.

## 2017-07-19 NOTE — Assessment & Plan Note (Deleted)
   Aeroallergen avoidance measures have been discussed and provided in written form.  A prescription has been provided for levocetirizine, 5 mg daily as needed.  A prescription has been provided for fluticasone nasal spray, one spray per nostril 1-2 times daily as needed. Proper nasal spray technique has been discussed and demonstrated.  Nasal saline lavage (NeilMed) has been recommended as needed and prior to medicated nasal sprays along with instructions for proper administration.  For thick post nasal drainage, add guaifenesin 1200 mg (Mucinex Maximum Strength)  twice daily as needed with adequate hydration as discussed.

## 2017-07-19 NOTE — Assessment & Plan Note (Signed)
   Treatment plan as outlined above for allergic rhinitis.  A prescription has been provided for Pazeo, one drop per eye daily as needed.  If insurance does not cover this medication, over-the-counter Zaditor may be used instead.  I have also recommended eye lubricant drops (i.e., Natural Tears) as needed.

## 2017-07-19 NOTE — Assessment & Plan Note (Addendum)
   Aeroallergen avoidance measures have been discussed and provided in written form.  A prescription has been provided for levocetirizine, 5 mg daily as needed.  A prescription has been provided for fluticasone nasal spray, one spray per nostril 1-2 times daily as needed. Proper nasal spray technique has been discussed and demonstrated.  Nasal saline lavage (NeilMed) has been recommended as needed and prior to medicated nasal sprays along with instructions for proper administration.  For thick post nasal drainage, add guaifenesin 1200 mg (Mucinex Maximum Strength)  twice daily as needed with adequate hydration as discussed.  If allergen avoidance measures and medications fail to adequately relieve symptoms, aeroallergen immunotherapy will be considered.

## 2017-07-19 NOTE — Patient Instructions (Addendum)
Angioedema Unclear etiology.  Food allergen skin testing was negative today despite a positive histamine control.  The patient is not taking an ACE inhibitor.  There are no concomitant symptoms concerning for anaphylaxis or constitutional symptoms worrisome for an underlying malignancy.  While it uvula angioedema may be caused by acid reflux, to be thorough we will order labs to rule out hereditary angioedema, acquired angioedema and other potential etiologies.   The following labs have been ordered: Tryptase, C4, C1 esterase inhibitor (quantitative and functional), C1q, CBC, CMP, and galactose-alpha-1,3-galactose IgE level.  The patient will be notified with further recommendations after lab results have returned.  Should symptoms recur, a  journal is to be kept recording any foods eaten, beverages consumed, medications taken, activities performed, and environmental conditions within a 6 hour period prior to the onset of symptoms. For any symptoms concerning for anaphylaxis, epinephrine is to be administered and 911 is to be called immediately.  A prescription has been provided for epinephrine 0.3 mg autoinjector 2 pack along with instructions for its proper administration.  Perennial allergic rhinitis  Aeroallergen avoidance measures have been discussed and provided in written form.  A prescription has been provided for levocetirizine, 5 mg daily as needed.  A prescription has been provided for fluticasone nasal spray, one spray per nostril 1-2 times daily as needed. Proper nasal spray technique has been discussed and demonstrated.  Nasal saline lavage (NeilMed) has been recommended as needed and prior to medicated nasal sprays along with instructions for proper administration.  For thick post nasal drainage, add guaifenesin 1200 mg (Mucinex Maximum Strength)  twice daily as needed with adequate hydration as discussed.  If allergen avoidance measures and medications fail to adequately  relieve symptoms, aeroallergen immunotherapy will be considered.  Allergic conjunctivitis  Treatment plan as outlined above for allergic rhinitis.  A prescription has been provided for Pazeo, one drop per eye daily as needed.  If insurance does not cover this medication, over-the-counter Zaditor may be used instead.  I have also recommended eye lubricant drops (i.e., Natural Tears) as needed.  Esophageal reflux  For now, continue appropriate reflux lifestyle modifications and omeprazole as prescribed.   No follow-ups on file.  Control of Mold Allergen  Mold and fungi can grow on a variety of surfaces provided certain temperature and moisture conditions exist.  Outdoor molds grow on plants, decaying vegetation and soil.  The major outdoor mold, Alternaria and Cladosporium, are found in very high numbers during hot and dry conditions.  Generally, a late Summer - Fall peak is seen for common outdoor fungal spores.  Rain will temporarily lower outdoor mold spore count, but counts rise rapidly when the rainy period ends.  The most important indoor molds are Aspergillus and Penicillium.  Dark, humid and poorly ventilated basements are ideal sites for mold growth.  The next most common sites of mold growth are the bathroom and the kitchen.  Outdoor [employer] 1. Use air conditioning and keep windows closed 2. Avoid exposure to decaying vegetation. 3. Avoid leaf raking. 4. Avoid grain handling. 5. Consider wearing a face mask if working in moldy areas.  Indoor Mold Control 1. Maintain humidity below 50%. 2. Clean washable surfaces with 5% bleach solution. 3. Remove sources e.g. Contaminated carpets.  Control of Dog or Cat Allergen  Avoidance is the best way to manage a dog or cat allergy. If you have a dog or cat and are allergic to dog or cats, consider removing the dog or cat from  the home. If you have a dog or cat but don't want to find it a new home, or if your family wants a pet  even though someone in the household is allergic, here are some strategies that may help keep symptoms at bay:  1. Keep the pet out of your bedroom and restrict it to only a few rooms. Be advised that keeping the dog or cat in only one room will not limit the allergens to that room. 2. Don't pet, hug or kiss the dog or cat; if you do, wash your hands with soap and water. 3. High-efficiency particulate air (HEPA) cleaners run continuously in a bedroom or living room can reduce allergen levels over time. 4. Place electrostatic material sheet in the air inlet vent in the bedroom. 5. Regular use of a high-efficiency vacuum cleaner or a central vacuum can reduce allergen levels. 6. Giving your dog or cat a bath at least once a week can reduce airborne allergen.

## 2017-07-20 ENCOUNTER — Encounter: Payer: Self-pay | Admitting: Allergy and Immunology

## 2017-07-23 ENCOUNTER — Encounter: Payer: Self-pay | Admitting: Family Medicine

## 2017-07-23 DIAGNOSIS — Z91018 Allergy to other foods: Secondary | ICD-10-CM

## 2017-07-28 LAB — CBC WITH DIFFERENTIAL/PLATELET
Basophils Absolute: 0 10*3/uL (ref 0.0–0.2)
Basos: 0 %
EOS (ABSOLUTE): 0.2 10*3/uL (ref 0.0–0.4)
Eos: 3 %
Hematocrit: 45.4 % (ref 37.5–51.0)
Hemoglobin: 15.9 g/dL (ref 13.0–17.7)
Immature Grans (Abs): 0 10*3/uL (ref 0.0–0.1)
Immature Granulocytes: 0 %
Lymphocytes Absolute: 1.7 10*3/uL (ref 0.7–3.1)
Lymphs: 27 %
MCH: 27.7 pg (ref 26.6–33.0)
MCHC: 35 g/dL (ref 31.5–35.7)
MCV: 79 fL (ref 79–97)
Monocytes Absolute: 0.5 10*3/uL (ref 0.1–0.9)
Monocytes: 8 %
Neutrophils Absolute: 3.9 10*3/uL (ref 1.4–7.0)
Neutrophils: 62 %
Platelets: 283 10*3/uL (ref 150–379)
RBC: 5.75 x10E6/uL (ref 4.14–5.80)
RDW: 13.8 % (ref 12.3–15.4)
WBC: 6.4 10*3/uL (ref 3.4–10.8)

## 2017-07-28 LAB — COMPREHENSIVE METABOLIC PANEL
ALT: 30 IU/L (ref 0–44)
AST: 23 IU/L (ref 0–40)
Albumin/Globulin Ratio: 2.4 — ABNORMAL HIGH (ref 1.2–2.2)
Albumin: 4.8 g/dL (ref 3.5–5.5)
Alkaline Phosphatase: 46 IU/L (ref 39–117)
BUN/Creatinine Ratio: 15 (ref 9–20)
BUN: 16 mg/dL (ref 6–24)
Bilirubin Total: 0.4 mg/dL (ref 0.0–1.2)
CO2: 27 mmol/L (ref 20–29)
Calcium: 10 mg/dL (ref 8.7–10.2)
Chloride: 100 mmol/L (ref 96–106)
Creatinine, Ser: 1.09 mg/dL (ref 0.76–1.27)
GFR calc Af Amer: 91 mL/min/{1.73_m2} (ref >59)
GFR calc non Af Amer: 79 mL/min/{1.73_m2} (ref >59)
Globulin, Total: 2 g/dL (ref 1.5–4.5)
Glucose: 83 mg/dL (ref 65–99)
Potassium: 4 mmol/L (ref 3.5–5.2)
Sodium: 144 mmol/L (ref 134–144)
Total Protein: 6.8 g/dL (ref 6.0–8.5)

## 2017-07-28 LAB — TRYPTASE: Tryptase: 3.9 ug/L (ref 2.2–13.2)

## 2017-07-28 LAB — ALPHA-GAL PANEL
Alpha Gal IgE*: 2.07 kU/L — ABNORMAL HIGH (ref <0.10)
Beef (Bos spp) IgE: 0.48 kU/L — ABNORMAL HIGH (ref <0.35)
Class Interpretation: 0
Class Interpretation: 1
Lamb/Mutton (Ovis spp) IgE: <0.1 kU/L (ref <0.35)
Pork (Sus spp) IgE: 0.31 kU/L (ref <0.35)

## 2017-07-28 LAB — COMPLEMENT COMPONENT C1Q: Complement C1Q: 16.7 mg/dL (ref 11.8–23.8)

## 2017-07-28 LAB — C1 ESTERASE INHIBITOR: C1INH SerPl-mCnc: 33 mg/dL (ref 21–39)

## 2017-07-28 LAB — C4 COMPLEMENT: Complement C4, Serum: 32 mg/dL (ref 14–44)

## 2017-07-28 LAB — C1 ESTERASE INHIBITOR, FUNCTIONAL: C1INH Functional/C1INH Total MFr SerPl: >92 %mean normal

## 2017-07-30 ENCOUNTER — Telehealth: Payer: Self-pay | Admitting: Allergy and Immunology

## 2017-07-30 DIAGNOSIS — T7800XA Anaphylactic reaction due to unspecified food, initial encounter: Secondary | ICD-10-CM

## 2017-07-30 NOTE — Telephone Encounter (Signed)
Patient's wife called and would like to discuss Blaire's test results, especially the Alpha Gal.

## 2017-07-30 NOTE — Telephone Encounter (Signed)
Called patient's wife. No answer. No voicemail left.

## 2017-08-02 NOTE — Telephone Encounter (Signed)
The patient's wife is concerned that the lab tests may have been erroneous.  Please put in another lab order for alpha gal panel.  I instructed that he should still avoid mammalian meat and have access to epinephrine autoinjector.

## 2017-08-02 NOTE — Telephone Encounter (Signed)
Patients wife is requesting to speak to Dr Verlin Fester. She sated the nurse did not answer her question about Gustafson alpha gal.  Please Advise

## 2017-08-02 NOTE — Telephone Encounter (Signed)
[phone] Wife (Amy Canady)

## 2017-08-02 NOTE — Telephone Encounter (Signed)
Called again and received no answer.

## 2017-08-03 NOTE — Telephone Encounter (Signed)
Order placed

## 2017-08-03 NOTE — Addendum Note (Signed)
Addended by: Horris Latino on: 08/03/2017 10:58 AM   Modules accepted: Orders

## 2017-08-03 NOTE — Telephone Encounter (Signed)
I spoke with patient and he advised me that per his wife and Dr. Verlin Fester he is to wait about a month before having the repeat alpha-gal panel. I am going to change the order to activate in about 3.5 weeks so that it will still be there and ready to go when he does have it drawn.

## 2017-08-03 NOTE — Addendum Note (Signed)
Addended by: Lucrezia Starch I on: 08/03/2017 02:14 PM   Modules accepted: Orders

## 2017-08-04 ENCOUNTER — Encounter: Payer: Self-pay | Admitting: Allergy and Immunology

## 2017-08-04 MED ORDER — CARBINOXAMINE MALEATE 4 MG PO TABS: 1.0000 | ORAL_TABLET | Freq: Three times a day (TID) | ORAL | 5 refills | Status: DC | PRN

## 2017-08-05 ENCOUNTER — Other Ambulatory Visit: Payer: Self-pay

## 2017-08-16 ENCOUNTER — Ambulatory Visit: Payer: Self-pay | Admitting: Allergy and Immunology

## 2017-08-19 ENCOUNTER — Encounter: Payer: Self-pay | Admitting: Family Medicine

## 2017-08-19 NOTE — Telephone Encounter (Signed)
After discussion with him, he and I have decided to hold his testosterone and see if any of his hypogonadal type symptoms recur.  He will keep me informed.  If he starts having difficulty, I will retest his testosterone level.

## 2017-08-23 ENCOUNTER — Encounter: Payer: Self-pay | Admitting: Medical

## 2017-08-23 ENCOUNTER — Ambulatory Visit (INDEPENDENT_AMBULATORY_CARE_PROVIDER_SITE_OTHER): Payer: Self-pay | Admitting: Medical

## 2017-08-23 VITALS — BP 138/80 | HR 76 | Temp 98.0°F | Ht 72.0 in | Wt 221.8 lb

## 2017-08-23 DIAGNOSIS — I10 Essential (primary) hypertension: Secondary | ICD-10-CM

## 2017-08-23 DIAGNOSIS — Z0289 Encounter for other administrative examinations: Secondary | ICD-10-CM | POA: Insufficient documentation

## 2017-08-23 LAB — POCT URINALYSIS DIP (PROADVANTAGE DEVICE)
Bilirubin, UA: NEGATIVE
Blood, UA: NEGATIVE
Glucose, UA: NEGATIVE mg/dL
Ketones, POC UA: NEGATIVE mg/dL
Leukocytes, UA: NEGATIVE
NITRITE UA: NEGATIVE
PH UA: 6 (ref 5.0–8.0)
PROTEIN UA: NEGATIVE mg/dL

## 2017-08-23 NOTE — Progress Notes (Signed)
Commercial Driver Medical Examination   Craig Byrd is a 52 y.o. male who presents today for a commercial driver fitness determination physical exam.  Last DOT physical a little over a year ago, had accidentally run out on date.   He rarely drives heavy equipment.  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    Medical care team includes:  Dr. Jill Alexanders here for primary care  The patient reports no problems.  Review of Systems A comprehensive review of systems was reviewed and noted as below:  Eye: - corrective lenses, -lasik surgery or other eye surgery, -glaucoma, -cataracts, -macular degeneration, -monocular vision, -medication for eye condition, -blurred vision,   Ears: -hearing problems, - hearing aids, -ear pain, -ear drainage, -ear fullness, -tinnitus, -recurrent ear infection, -previous ear surgery, - vertigo, -meniere's disease  Endocrine: -polydipsia, -polyuria, -weight loss, -fainting, -dizziness, - altered or loss of consciousness, -hypoglycemia  Cardiovascular: -heart disease, -CHF, -heart attack, -cardiac stents, -bypass surgery, -other heart surgery, +hypertension, -blood clots, -pacemaker, -medications for heart condition, -chest pain, -SOB, -palpitations, -fainting, -dizziness, -dyspnea  Respiratory: -asthma, -COPD, other lung disease, -smoker, -chest tightness, - wheezing, -snoring, -daytime sleepiness, -sleep apnea or uses CPAP, -narcolepsy, -sleeping disorder  Allergy: -uncontrollable sneezing or allergy symptoms  Musculoskeletal: -missing body parts, -muscle disease, -bone disease, -spine injury, -low back pain, -medication for joints, bones, muscles or pain, -physical limitations, -joint pain, -neck pain, -limitations of neck ROM, -back surgery, orthopedic surgery, -rheumatologic condition, -gout  Neurologic: -neurologic disease, -dementia, -seizures, -parkinson's, -tremor, -memory  problems, -weakness, -numbness, -tingling, -medication for neurologic condition, -medications for sleep condition  Gastric: -abdominal pain, -chronic diarrhea or IBS, -uncontrollable nausea  Kidney/Renal: -hematuria, -dialysis, kidney disease, polycystic kidney disease  Psychiatric: -homicidal thoughts, -suicidal thoughts, -prior suicide attempts, -get into fights/hurting others, -memory or concentration problems, -delusions, -hallucinations, -hospitalization for mental health problem, -depression, -anxiety, -bipolar  Drug use: - none   Reviewed their medical, surgical, family, social, medication, and allergy history and updated chart as appropriate.        Objective:   Physical Exam  BP 138/80   Pulse 76   Temp 98 F (36.7 C) (Oral)   Ht 6' (1.829 m)   Wt 221 lb 12.8 oz (100.6 kg)   SpO2 97%   BMI 30.08 kg/m   General appearance: alert, no distress, WD/WN, white male Skin: unremarkable HEENT: normocephalic, conjunctiva/corneas normal, sclerae anicteric, PERRLA, EOMi, nares patent, no discharge or erythema, pharynx normal Oral cavity: MMM, tongue normal, teeth normal Neck: supple, no lymphadenopathy, no thyromegaly, no masses, normal ROM, no bruit Chest: non tender, normal shape and expansion Heart: RRR, normal S1, S2, no murmurs Lungs: CTA bilaterally, no wheezes, rhonchi, or rales Abdomen: +bs, soft, non tender, non distended, no masses, no hepatomegaly, no splenomegaly, no bruits Back: non tender, normal ROM, no scoliosis Musculoskeletal: upper extremities non tender, no obvious deformity, normal ROM throughout, lower extremities non tender, no obvious deformity, normal ROM throughout Extremities: no edema, no cyanosis, no clubbing Pulses: 2+ symmetric, upper and lower extremities, normal cap refill Neurological: alert, oriented x 3, CN2-12 intact, strength normal upper extremities and lower extremities, sensation normal throughout, DTRs 2+ throughout, no cerebellar  signs, gait normal Psychiatric: normal affect, behavior normal, pleasant  GU: normal male external genitalia, circumcised, nontender, no masses, no hernia, no lymphadenopathy Rectal: deferred    Assessment:   Encounter Diagnoses  Name Primary?  . Health examination of defined subpopulation  Yes  . Essential hypertension       Plan:    HTN controled  Completed forms for 2 year certificate.    Advised he return with Dr. Redmond School for routine preventative care, baseline EKG, colonoscopy referral  Signed off on his mission trip form to go to [place]

## 2017-08-31 ENCOUNTER — Encounter: Payer: Self-pay | Admitting: Family Medicine

## 2017-08-31 ENCOUNTER — Other Ambulatory Visit: Payer: Self-pay | Admitting: Family Medicine

## 2017-08-31 MED ORDER — TYPHOID VACCINE PO CPDR: 1.0000 | DELAYED_RELEASE_CAPSULE | ORAL | 0 refills | Status: DC

## 2017-12-12 ENCOUNTER — Encounter: Payer: Self-pay | Admitting: Family Medicine

## 2017-12-13 MED ORDER — TADALAFIL 5 MG PO TABS: ORAL_TABLET | ORAL | 5 refills | Status: DC

## 2017-12-14 ENCOUNTER — Telehealth: Payer: Self-pay

## 2017-12-14 NOTE — Telephone Encounter (Signed)
Pharmacy sent fax asking if patient can have a 90 day supply. Verbal was given for patient.

## 2018-01-13 ENCOUNTER — Encounter: Payer: Self-pay | Admitting: Family Medicine

## 2018-01-17 ENCOUNTER — Ambulatory Visit (INDEPENDENT_AMBULATORY_CARE_PROVIDER_SITE_OTHER): Payer: Self-pay | Admitting: Family Medicine

## 2018-01-17 ENCOUNTER — Encounter: Payer: Self-pay | Admitting: Family Medicine

## 2018-01-17 VITALS — BP 130/86 | HR 63 | Temp 97.9°F | Wt 222.2 lb

## 2018-01-17 DIAGNOSIS — I10 Essential (primary) hypertension: Secondary | ICD-10-CM

## 2018-01-17 DIAGNOSIS — Z91018 Allergy to other foods: Secondary | ICD-10-CM

## 2018-01-17 DIAGNOSIS — E291 Testicular hypofunction: Secondary | ICD-10-CM

## 2018-01-17 DIAGNOSIS — Z800 Family history of malignant neoplasm of digestive organs: Secondary | ICD-10-CM

## 2018-01-17 MED ORDER — LISINOPRIL-HYDROCHLOROTHIAZIDE 10-12.5 MG PO TABS: 1.0000 | ORAL_TABLET | Freq: Every day | ORAL | 3 refills | Status: DC

## 2018-01-17 NOTE — Progress Notes (Signed)
   Subjective:    Patient ID: Craig Byrd, male    DOB: 1965/10/10, 52 y.o.   MRN: [id number]  HPI He is here for an interval evaluation.  He is now being followed by another dietitian for his hypogonadism.  Apparently his wife is compounding this as she is a Software engineer and working with another provider to monitor testosterone levels. Also recently he was evaluated and found to have alpha gal and thinks that his swelling was related to that rather than to an ACE inhibitor.  He would like to be placed back on his previous med is it was apparently working fairly well. He does have a family history of colon cancer and is now 56.  Apparently did have Cologuard done last year which was negative.   Review of Systems     Objective:   Physical Exam Alert and in no distress.  Blood pressure is recorded.       Assessment & Plan:  Essential hypertension - Plan: lisinopril-hydrochlorothiazide (PRINZIDE,ZESTORETIC) 10-12.5 MG tablet  Allergy to alpha-gal  Family history of colon cancer - Plan: Ambulatory referral to Gastroenterology  Hypogonadism male He will continue to be followed for his hypogonadism at a different site. I will switch him to lisinopril which worked well for him.  He will return here in 1 month. Cautioned him on eating red meats. Explained that even though he had a Cologuard last year, the standard of care would be for him to have a colonoscopy. His record indicates he has not gotten his tetanus however he did state he got the last trip overseas.  He will send me information concerning this.

## 2018-02-18 ENCOUNTER — Encounter: Payer: Self-pay | Admitting: Gastroenterology

## 2018-02-21 ENCOUNTER — Ambulatory Visit (INDEPENDENT_AMBULATORY_CARE_PROVIDER_SITE_OTHER): Payer: Self-pay | Admitting: Family Medicine

## 2018-02-21 ENCOUNTER — Encounter: Payer: Self-pay | Admitting: Family Medicine

## 2018-02-21 VITALS — BP 126/82 | HR 70 | Temp 97.9°F | Wt 222.6 lb

## 2018-02-21 DIAGNOSIS — I10 Essential (primary) hypertension: Secondary | ICD-10-CM

## 2018-02-21 NOTE — Progress Notes (Signed)
   Subjective:    Patient ID: Craig Byrd, male    DOB: 11-Oct-1965, 52 y.o.   MRN: [id number]  HPI       Objective:   Physical Exam BP 126/82 (BP Location: Left Arm, Patient Position: Sitting)   Pulse 70   Temp 97.9 F (36.6 C)   Wt 222 lb 9.6 oz (101 kg)   SpO2 96%   BMI 30.19 kg/m   General Appearance:    Alert, cooperative, no distress, appears stated age      Assessment & Plan:  Essential hypertension He will continue on his present medication regimen.  He is followed by another physician for his testosterone.  He is scheduled for a colonoscopy.

## 2018-03-18 ENCOUNTER — Ambulatory Visit (AMBULATORY_SURGERY_CENTER): Payer: Self-pay | Admitting: *Deleted

## 2018-03-18 VITALS — Ht 72.0 in | Wt 218.0 lb

## 2018-03-18 DIAGNOSIS — Z1211 Encounter for screening for malignant neoplasm of colon: Secondary | ICD-10-CM

## 2018-03-18 DIAGNOSIS — Z800 Family history of malignant neoplasm of digestive organs: Secondary | ICD-10-CM

## 2018-03-18 MED ORDER — NA SULFATE-K SULFATE-MG SULF 17.5-3.13-1.6 GM/177ML PO SOLN: ORAL | 0 refills | Status: DC

## 2018-03-18 NOTE — Progress Notes (Signed)
Patient denies any allergies to eggs or soy. Patient denies any problems with anesthesia/sedation. Patient denies any oxygen use at home. Patient denies taking any diet/weight loss medications or blood thinners. EMMI education offered, pt declined. Pt declined suprep coupon, states wife is a Software engineer.

## 2018-03-25 ENCOUNTER — Encounter: Payer: No Typology Code available for payment source | Admitting: Gastroenterology

## 2018-03-25 ENCOUNTER — Ambulatory Visit (AMBULATORY_SURGERY_CENTER): Payer: Self-pay | Admitting: Gastroenterology

## 2018-03-25 ENCOUNTER — Encounter: Payer: Self-pay | Admitting: Gastroenterology

## 2018-03-25 VITALS — BP 110/73 | HR 67 | Temp 96.9°F | Resp 8 | Ht 72.0 in | Wt 222.0 lb

## 2018-03-25 DIAGNOSIS — Z800 Family history of malignant neoplasm of digestive organs: Secondary | ICD-10-CM

## 2018-03-25 DIAGNOSIS — D120 Benign neoplasm of cecum: Secondary | ICD-10-CM

## 2018-03-25 DIAGNOSIS — K635 Polyp of colon: Secondary | ICD-10-CM

## 2018-03-25 DIAGNOSIS — Z1211 Encounter for screening for malignant neoplasm of colon: Secondary | ICD-10-CM

## 2018-03-25 MED ORDER — SODIUM CHLORIDE 0.9 % IV SOLN: 500.0000 mL | Freq: Once | INTRAVENOUS | Status: DC

## 2018-03-25 NOTE — Op Note (Signed)
[facility] Patient Name: Craig Byrd Procedure Date: 03/25/2018 9:20 AM MRN: [id number] Endoscopist: Mallie Mussel L. Loletha Carrow , MD Age: 52 Referring MD:  Date of Birth: [birth year] Gender: Male Account #: [id number] Procedure:                Colonoscopy Indications:              Colon cancer screening in patient at increased                            risk: Rectal cancer in father Medicines:                Monitored Anesthesia Care Procedure:                Pre-Anesthesia Assessment:                           - Prior to the procedure, a History and Physical                            was performed, and patient medications and                            allergies were reviewed. The patient's tolerance of                            previous anesthesia was also reviewed. The risks                            and benefits of the procedure and the sedation                            options and risks were discussed with the patient.                            All questions were answered, and informed consent                            was obtained. Prior Anticoagulants: The patient has                            taken no previous anticoagulant or antiplatelet                            agents. ASA Grade Assessment: II - A patient with                            mild systemic disease. After reviewing the risks                            and benefits, the patient was deemed in                            satisfactory condition to undergo the procedure.  After obtaining informed consent, the colonoscope                            was passed under direct vision. Throughout the                            procedure, the patient's blood pressure, pulse, and                            oxygen saturations were monitored continuously. The                            Colonoscope was introduced through the anus and                            advanced to the the cecum, identified  by                            appendiceal orifice and ileocecal valve. The                            colonoscopy was performed without difficulty. The                            patient tolerated the procedure well. The quality                            of the bowel preparation was excellent. The                            ileocecal valve, appendiceal orifice, and rectum                            were photographed. Scope In: 9:27:43 AM Scope Out: 9:45:28 AM Scope Withdrawal Time: 0 hours 15 minutes 51 seconds  Total Procedure Duration: 0 hours 17 minutes 45 seconds  Findings:                 The perianal and digital rectal examinations were                            normal.                           A 10 mm polyp was found in the cecum. The polyp was                            semi-pedunculated, lobulated and ulcerated -                            located between the AO and ICV. Area was                            successfully injected with 4 mL saline for a lift  polypectomy. The polyp was removed with a hot                            snare. Resection and retrieval were complete.                           The exam was otherwise without abnormality on                            direct and retroflexion views. Complications:            No immediate complications. Estimated Blood Loss:     Estimated blood loss: none. Impression:               - One 10 mm polyp in the cecum, removed with a hot                            snare. Resected and retrieved. Injected.                           - The examination was otherwise normal on direct                            and retroflexion views. Recommendation:           - Patient has a contact number available for                            emergencies. The signs and symptoms of potential                            delayed complications were discussed with the                            patient. Return to normal  activities tomorrow.                            Written discharge instructions were provided to the                            patient.                           - Resume previous diet.                           - Continue present medications.                           - Await pathology results.                           - Repeat colonoscopy is recommended for                            surveillance. The colonoscopy date will be  determined after pathology results from today's                            exam become available for review. Michiah Masse L. Loletha Carrow, MD 03/25/2018 9:52:13 AM This report has been signed electronically.

## 2018-03-25 NOTE — Progress Notes (Signed)
Called to room to assist during endoscopic procedure.  Patient ID and intended procedure confirmed with present staff. Received instructions for my participation in the procedure from the performing physician.  

## 2018-03-25 NOTE — Patient Instructions (Signed)
Handout on polyps given   YOU HAD AN ENDOSCOPIC PROCEDURE TODAY AT THE [facility]:   Refer to the procedure report that was given to you for any specific questions about what was found during the examination.  If the procedure report does not answer your questions, please call your gastroenterologist to clarify.  If you requested that your care partner not be given the details of your procedure findings, then the procedure report has been included in a sealed envelope for you to review at your convenience later.  YOU SHOULD EXPECT: Some feelings of bloating in the abdomen. Passage of more gas than usual.  Walking can help get rid of the air that was put into your GI tract during the procedure and reduce the bloating. If you had a lower endoscopy (such as a colonoscopy or flexible sigmoidoscopy) you may notice spotting of blood in your stool or on the toilet paper. If you underwent a bowel prep for your procedure, you may not have a normal bowel movement for a few days.  Please Note:  You might notice some irritation and congestion in your nose or some drainage.  This is from the oxygen used during your procedure.  There is no need for concern and it should clear up in a day or so.  SYMPTOMS TO REPORT IMMEDIATELY:   Following lower endoscopy (colonoscopy or flexible sigmoidoscopy):  Excessive amounts of blood in the stool  Significant tenderness or worsening of abdominal pains  Swelling of the abdomen that is new, acute  Fever of 100F or higher   For urgent or emergent issues, a gastroenterologist can be reached at any hour by calling [phone].   DIET:  We do recommend a small meal at first, but then you may proceed to your regular diet.  Drink plenty of fluids but you should avoid alcoholic beverages for 24 hours.  ACTIVITY:  You should plan to take it easy for the rest of today and you should NOT DRIVE or use heavy machinery until tomorrow (because of the sedation  medicines used during the test).    FOLLOW UP: Our staff will call the number listed on your records the next business day following your procedure to check on you and address any questions or concerns that you may have regarding the information given to you following your procedure. If we do not reach you, we will leave a message.  However, if you are feeling well and you are not experiencing any problems, there is no need to return our call.  We will assume that you have returned to your regular daily activities without incident.  If any biopsies were taken you will be contacted by phone or by letter within the next 1-3 weeks.  Please call us at [phone] if you have not heard about the biopsies in 3 weeks.    SIGNATURES/CONFIDENTIALITY: You and/or your care partner have signed paperwork which will be entered into your electronic medical record.  These signatures attest to the fact that that the information above on your After Visit Summary has been reviewed and is understood.  Full responsibility of the confidentiality of this discharge information lies with you and/or your care-partner. 

## 2018-03-25 NOTE — Progress Notes (Signed)
Alert and oriented x 3, pleased with MAC, report to RN

## 2018-03-28 ENCOUNTER — Telehealth: Payer: Self-pay

## 2018-03-28 NOTE — Telephone Encounter (Signed)
  Follow up Call-  Call back number 03/25/2018  Post procedure Call Back phone  # [phone]  Permission to leave phone message Yes  Some recent data might be hidden     Patient questions:  Do you have a fever, pain , or abdominal swelling? N Pain Score  0  Have you tolerated food without any problems? Y  Have you been able to return to your normal activities? Y  Do you have any questions about your discharge instructions: Diet   N Medications  N Follow up visit  N  Do you have questions or concerns about your Care? N  Actions: * If pain score is 4 or above: No action needed, pain <4.

## 2018-04-07 ENCOUNTER — Encounter: Payer: Self-pay | Admitting: Gastroenterology

## 2018-08-19 DIAGNOSIS — Z0279 Encounter for issue of other medical certificate: Secondary | ICD-10-CM

## 2018-09-26 ENCOUNTER — Encounter: Payer: Self-pay | Admitting: Family Medicine

## 2018-09-26 ENCOUNTER — Other Ambulatory Visit: Payer: Self-pay

## 2018-09-26 ENCOUNTER — Ambulatory Visit (INDEPENDENT_AMBULATORY_CARE_PROVIDER_SITE_OTHER): Payer: Self-pay | Admitting: Family Medicine

## 2018-09-26 VITALS — BP 124/98 | Wt 212.0 lb

## 2018-09-26 DIAGNOSIS — Z125 Encounter for screening for malignant neoplasm of prostate: Secondary | ICD-10-CM

## 2018-09-26 DIAGNOSIS — E291 Testicular hypofunction: Secondary | ICD-10-CM

## 2018-09-26 DIAGNOSIS — J0100 Acute maxillary sinusitis, unspecified: Secondary | ICD-10-CM

## 2018-09-26 DIAGNOSIS — J3089 Other allergic rhinitis: Secondary | ICD-10-CM

## 2018-09-26 MED ORDER — AMOXICILLIN-POT CLAVULANATE 875-125 MG PO TABS: 1.0000 | ORAL_TABLET | Freq: Two times a day (BID) | ORAL | 0 refills | Status: DC

## 2018-09-26 NOTE — Progress Notes (Signed)
   Subjective:    Patient ID: Craig Byrd, male    DOB: Nov 01, 1965, 53 y.o.   MRN: [id number]  HPI Documentation for virtual telephone encounter.  Documentation for virtual audio and video telecommunications through [place] encounter:  The patient was located at home. The provider was located in the office. The patient did consent to this visit and is aware of possible charges through their insurance for this visit. The other persons participating in this telemedicine service were none. Time spent on call was 5 minutes and in review of previous records >15 minutes total. This virtual service is not related to other E/M service within previous 7 days. Approximately 3-week ago while diving he noted the onset of sinus pressure in the frontal sinus area.  It has been intermittent in nature and now also includes the maxillary sinus but no postnasal drainage, fever, chills, sore throat or earache. He also will like to switch back to my care for his testosterone.  Apparently he has been getting topical testosterone at 6% with 80 mg through [facility].  He has not had blood work in quite some time.    Review of Systems     Objective:   Physical Exam Alert and in no distress otherwise not examined       Assessment & Plan:  Acute non-recurrent maxillary sinusitis - Plan: augmentin  Hypogonadism in male - Plan: Testosterone, CBC with Differential/Platelet, Comprehensive metabolic panel, .  Perennial allergic rhinitis - Plan: Use Rhinocort  Screening for prostate cancer - Plan: PSA, . I will go ahead and treat the sinus infection also recommend he use Rhinocort for that.  If he has further trouble he will call me. We will also do blood work on his testosterone.  Apparently he was using saliva testing as well as blood work where he was cared for in the past.

## 2018-09-27 LAB — CBC WITH DIFFERENTIAL/PLATELET
Basophils Absolute: 0 10*3/uL (ref 0.0–0.2)
Basos: 1 %
EOS (ABSOLUTE): 0.2 10*3/uL (ref 0.0–0.4)
Eos: 3 %
Hematocrit: 48.3 % (ref 37.5–51.0)
Hemoglobin: 15.9 g/dL (ref 13.0–17.7)
Immature Grans (Abs): 0 10*3/uL (ref 0.0–0.1)
Immature Granulocytes: 0 %
Lymphocytes Absolute: 2 10*3/uL (ref 0.7–3.1)
Lymphs: 33 %
MCH: 27.1 pg (ref 26.6–33.0)
MCHC: 32.9 g/dL (ref 31.5–35.7)
MCV: 82 fL (ref 79–97)
Monocytes Absolute: 0.5 10*3/uL (ref 0.1–0.9)
Monocytes: 7 %
Neutrophils Absolute: 3.4 10*3/uL (ref 1.4–7.0)
Neutrophils: 56 %
Platelets: 292 10*3/uL (ref 150–450)
RBC: 5.87 x10E6/uL — ABNORMAL HIGH (ref 4.14–5.80)
RDW: 12.5 % (ref 11.6–15.4)
WBC: 6.1 10*3/uL (ref 3.4–10.8)

## 2018-09-27 LAB — COMPREHENSIVE METABOLIC PANEL
ALT: 47 IU/L — ABNORMAL HIGH (ref 0–44)
AST: 29 IU/L (ref 0–40)
Albumin/Globulin Ratio: 2.3 — ABNORMAL HIGH (ref 1.2–2.2)
Albumin: 4.9 g/dL (ref 3.8–4.9)
Alkaline Phosphatase: 54 IU/L (ref 39–117)
BUN/Creatinine Ratio: 12 (ref 9–20)
BUN: 15 mg/dL (ref 6–24)
Bilirubin Total: 0.6 mg/dL (ref 0.0–1.2)
CO2: 25 mmol/L (ref 20–29)
Calcium: 10.3 mg/dL — ABNORMAL HIGH (ref 8.7–10.2)
Chloride: 101 mmol/L (ref 96–106)
Creatinine, Ser: 1.24 mg/dL (ref 0.76–1.27)
GFR calc Af Amer: 77 mL/min/{1.73_m2} (ref >59)
GFR calc non Af Amer: 67 mL/min/{1.73_m2} (ref >59)
Globulin, Total: 2.1 g/dL (ref 1.5–4.5)
Glucose: 91 mg/dL (ref 65–99)
Potassium: 4.5 mmol/L (ref 3.5–5.2)
Sodium: 140 mmol/L (ref 134–144)
Total Protein: 7 g/dL (ref 6.0–8.5)

## 2018-09-27 LAB — PSA: Prostate Specific Ag, Serum: 1.1 ng/mL (ref 0.0–4.0)

## 2018-09-27 LAB — TESTOSTERONE: Testosterone: 518 ng/dL (ref 264–916)

## 2019-01-19 ENCOUNTER — Other Ambulatory Visit: Payer: Self-pay | Admitting: Otolaryngology

## 2019-01-19 DIAGNOSIS — J328 Other chronic sinusitis: Secondary | ICD-10-CM

## 2019-01-31 ENCOUNTER — Other Ambulatory Visit: Payer: Self-pay

## 2019-01-31 ENCOUNTER — Ambulatory Visit
Admission: RE | Admit: 2019-01-31 | Discharge: 2019-01-31 | Disposition: A | Discharge status: Home / Self Care | Payer: No Typology Code available for payment source | Source: Ambulatory Visit | Attending: Otolaryngology | Admitting: Otolaryngology

## 2019-01-31 DIAGNOSIS — J328 Other chronic sinusitis: Secondary | ICD-10-CM

## 2019-02-08 ENCOUNTER — Other Ambulatory Visit: Payer: Self-pay | Admitting: Family Medicine

## 2019-02-08 DIAGNOSIS — I10 Essential (primary) hypertension: Secondary | ICD-10-CM

## 2019-03-15 ENCOUNTER — Telehealth: Payer: Self-pay | Admitting: Family Medicine

## 2019-03-15 NOTE — Telephone Encounter (Signed)
Pt called and wants to see if he can get his blood pressure medication changed because his bottom number has still been running high

## 2019-03-15 NOTE — Telephone Encounter (Signed)
Called and left had to leave message with  pt per dr Redmond School to get blood pressure reading and to see if pt was using right cuff size

## 2019-03-16 MED ORDER — LISINOPRIL-HYDROCHLOROTHIAZIDE 20-12.5 MG PO TABS: 1.0000 | ORAL_TABLET | Freq: Every day | ORAL | 3 refills | Status: DC

## 2019-03-16 NOTE — Telephone Encounter (Signed)
Pt was advised and will start new med. Craig Byrd

## 2019-03-16 NOTE — Telephone Encounter (Signed)
Tell him that he increased his lisinopril to 20/12.5 and continue to monitor the blood pressure.  Have him call me with his results in about a month

## 2019-03-16 NOTE — Telephone Encounter (Signed)
Pt called back and states that his BP readings are  125/85 126/88 114/79 123/85 126/81 126/89 125/93 124/88 132/86 134/96 116/93 124/94 126/92 and that was today reading, States these are reading between October, November and December And that his weight is ranging from 210 to 222, And is using the proper cuff and they he has been in and had his BP cuff measured with ours before, informed pt you may need to do a visit he said that was ok if needed   Pt states if you send anything in to send it to the [place], [place]

## 2019-04-16 ENCOUNTER — Encounter: Payer: Self-pay | Admitting: Family Medicine

## 2019-04-17 ENCOUNTER — Other Ambulatory Visit: Payer: Self-pay

## 2019-04-17 ENCOUNTER — Encounter: Payer: Self-pay | Admitting: Family Medicine

## 2019-04-17 ENCOUNTER — Ambulatory Visit (INDEPENDENT_AMBULATORY_CARE_PROVIDER_SITE_OTHER): Payer: Self-pay | Admitting: Family Medicine

## 2019-04-17 VITALS — BP 123/92 | Temp 98.6°F | Wt 222.0 lb

## 2019-04-17 DIAGNOSIS — U071 COVID-19: Secondary | ICD-10-CM

## 2019-04-17 DIAGNOSIS — I10 Essential (primary) hypertension: Secondary | ICD-10-CM

## 2019-04-17 DIAGNOSIS — E291 Testicular hypofunction: Secondary | ICD-10-CM

## 2019-04-17 MED ORDER — NONFORMULARY OR COMPOUNDED ITEM: 5 refills | Status: DC

## 2019-04-17 NOTE — Progress Notes (Signed)
   Subjective:    Patient ID: Craig Byrd, male    DOB: 10/24/1965, 54 y.o.   MRN: [id number]  HPI Documentation for virtual telephone encounter.  Documentation for virtual audio and video telecommunications through [place] encounter: The patient was located at home. The provider was located in the office. The patient did consent to this visit and is aware of possible charges through their insurance for this visit. The other persons participating in this telemedicine service were none. This virtual service is not related to other E/M service within previous 7 days. The virtual visit concerning his blood pressure.  He did send in blood pressure readings with diastolics elevated except for the last 1 which was in a good range.  For a while he was taking double dose of the 20/12.5. Also he recently ran out of his testerone.  He was supposed to come in for testosterone testing but developed Covid around Christmas.  Since then he has had intermittent difficulty with fatigue and during the actual outbreak of Covid did have abnormal dreams.  He is now dealing with intermittent myalgias as well.    Review of Systems     Objective:   Physical Exam Alert and in no distress and appeared normal visually.      Assessment & Plan:  Hypogonadism in male - Plan: NONFORMULARY OR COMPOUNDED ITEM  Essential hypertension  COVID-19 virus infection I will renew his testosterone and see him in several months.  He is to continue on his present 20/12.5 and call me in 1 month with the blood pressure readings. I then discussed Covid with him.  Recommend he treat himself symptomatically.

## 2019-04-18 ENCOUNTER — Telehealth: Payer: Self-pay | Admitting: Family Medicine

## 2019-04-18 DIAGNOSIS — J0100 Acute maxillary sinusitis, unspecified: Secondary | ICD-10-CM

## 2019-04-18 NOTE — Telephone Encounter (Signed)
Pts wife called and said he wanted to go to [facility] for a 2nd opinion but they are requiring him to have a referral.

## 2019-04-19 MED ORDER — AMOXICILLIN-POT CLAVULANATE 875-125 MG PO TABS: 1.0000 | ORAL_TABLET | Freq: Two times a day (BID) | ORAL | 0 refills | Status: DC

## 2019-04-19 MED ORDER — PREDNISONE 10 MG (48) PO TBPK: ORAL_TABLET | ORAL | 0 refills | Status: DC

## 2019-04-19 NOTE — Telephone Encounter (Signed)
He called concerning getting a second opinion from ENT concerning his sinus disease.  He was treated by me in July with Augmentin which really did not help much.  He was then seen by ENT and a CT scan was done.  I did review the CT scan and it did show mucosal thickening.  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  He does complain of continued difficulty with sinus pressure, PND and dry cough.  I discussed options and I will give him a steroid Dosepak and Augmentin and if this is not successful, referral back to the previous ENT I think would be appropriate.

## 2019-04-27 ENCOUNTER — Telehealth: Payer: Self-pay | Admitting: Internal Medicine

## 2019-04-27 NOTE — Telephone Encounter (Signed)
I do not see the Covid result in the record.  Once we get it in the record we can certainly write that letter for him.

## 2019-04-27 NOTE — Telephone Encounter (Signed)
Under care everywhere and its negative.

## 2019-04-27 NOTE — Telephone Encounter (Signed)
Call him and let him know that we see no positive tests so if he can tell us where he had the positive test so we can get a copy we can write that letter

## 2019-04-27 NOTE — Telephone Encounter (Signed)
Pt's wife called and states that pt is going out of the country on February 3rd but in order to return to the states, he has to either have a negative covid test or have had a positive test with a letter that he has recovered. Pt had covid late last year and needs a note stating he was positive and has recovered. They can print letter off of mychart

## 2019-04-27 NOTE — Telephone Encounter (Signed)
Left message for pt to call back to give [place] a copy of results of positive test

## 2019-04-28 ENCOUNTER — Encounter: Payer: Self-pay | Admitting: Internal Medicine

## 2019-04-28 ENCOUNTER — Encounter: Payer: Self-pay | Admitting: Family Medicine

## 2019-04-28 NOTE — Telephone Encounter (Signed)
Pt sent mychart message of results

## 2019-05-02 ENCOUNTER — Encounter: Payer: Self-pay | Admitting: Family Medicine

## 2019-06-16 ENCOUNTER — Telehealth: Payer: Self-pay | Admitting: Family Medicine

## 2019-06-16 NOTE — Telephone Encounter (Signed)
Find out when he wants to get the blood work and we can also set him up for a med check appointment.  I can put the orders in when I have a better idea on how to put in the future order

## 2019-06-16 NOTE — Telephone Encounter (Signed)
Pt called requesting to come in for lab work states it was time for his Testerone lab work, and also would like to have routine lab work states he has not had done since he was 48. Pt also needs a refill on his testosterone please send to the [facility]

## 2019-06-16 NOTE — Telephone Encounter (Signed)
[place]

## 2019-06-20 ENCOUNTER — Other Ambulatory Visit: Payer: Self-pay

## 2019-06-20 DIAGNOSIS — E291 Testicular hypofunction: Secondary | ICD-10-CM

## 2019-06-20 NOTE — Telephone Encounter (Signed)
Renew his testosterone and schedule a med check appointment for next week

## 2019-06-20 NOTE — Telephone Encounter (Signed)
Pt advised he just needs his testosterone checked. His last dose was actually Sunday . Please advise if pt needs a new script and later appt. [place]

## 2019-06-20 NOTE — Telephone Encounter (Signed)
Called in med and pt appt was made. [place]

## 2019-06-23 ENCOUNTER — Other Ambulatory Visit: Payer: Self-pay

## 2019-07-10 ENCOUNTER — Other Ambulatory Visit: Payer: No Typology Code available for payment source

## 2019-07-10 ENCOUNTER — Ambulatory Visit: Payer: No Typology Code available for payment source | Attending: Internal Medicine

## 2019-07-10 DIAGNOSIS — Z20822 Contact with and (suspected) exposure to covid-19: Secondary | ICD-10-CM

## 2019-07-11 LAB — SARS-COV-2, NAA 2 DAY TAT

## 2019-07-11 LAB — NOVEL CORONAVIRUS, NAA: SARS-CoV-2, NAA: NOT DETECTED

## 2019-07-23 ENCOUNTER — Encounter: Payer: Self-pay | Admitting: Family Medicine

## 2019-07-24 MED ORDER — AMLODIPINE BESYLATE 5 MG PO TABS: 5.0000 mg | ORAL_TABLET | Freq: Every day | ORAL | 3 refills | Status: DC

## 2019-07-25 ENCOUNTER — Other Ambulatory Visit: Payer: Self-pay

## 2019-07-25 DIAGNOSIS — E291 Testicular hypofunction: Secondary | ICD-10-CM

## 2019-07-26 ENCOUNTER — Encounter: Payer: Self-pay | Admitting: Family Medicine

## 2019-07-26 LAB — TESTOSTERONE: Testosterone: 339 ng/dL (ref 264–916)

## 2019-07-27 ENCOUNTER — Other Ambulatory Visit: Payer: Self-pay

## 2019-07-27 MED ORDER — LISINOPRIL-HYDROCHLOROTHIAZIDE 20-12.5 MG PO TABS: 1.0000 | ORAL_TABLET | Freq: Every day | ORAL | 3 refills | Status: DC

## 2019-08-04 ENCOUNTER — Encounter: Payer: Self-pay | Admitting: Family Medicine

## 2019-08-04 ENCOUNTER — Telehealth: Payer: Self-pay

## 2019-08-04 MED ORDER — TADALAFIL 5 MG PO TABS: ORAL_TABLET | ORAL | 5 refills | Status: DC

## 2019-08-04 NOTE — Telephone Encounter (Signed)
New dose on testosterone has been called in. Pt was  By voice mail [facility]

## 2019-08-07 ENCOUNTER — Telehealth: Payer: Self-pay

## 2019-08-07 NOTE — Telephone Encounter (Signed)
Spoke to pt and advised of the new amount on his testosterone has been increased. And pharmacy has been advised. [place]

## 2019-08-16 ENCOUNTER — Other Ambulatory Visit: Payer: Self-pay | Admitting: Allergy and Immunology

## 2019-08-28 ENCOUNTER — Encounter: Payer: Self-pay | Admitting: Family Medicine

## 2020-02-22 IMAGING — CT CT MAXILLOFACIAL W/O CM
3 of 5 series · 14 of 47 positions shown, 16 images · non-contrast
Comparison: None.

CLINICAL DATA: Chronic sinusitis, 4-5 months duration.

EXAM:
CT MAXILLOFACIAL WITHOUT CONTRAST
TECHNIQUE: Multidetector CT images of the paranasal sinuses were obtained using
the standard protocol without intravenous contrast.

[Series 4: sinus 2.00 hr60 s3 cor · coronal · 0.30mm/px · 3 of 129 slices shown]
[im 43/129  bone]
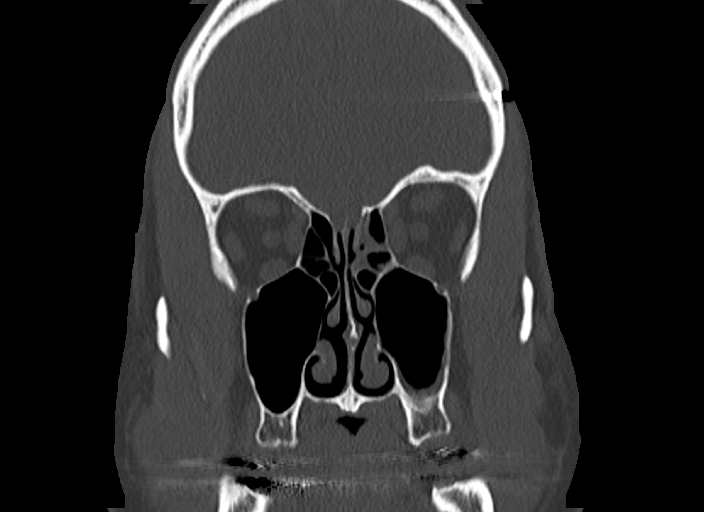
[im 57/129  bone]
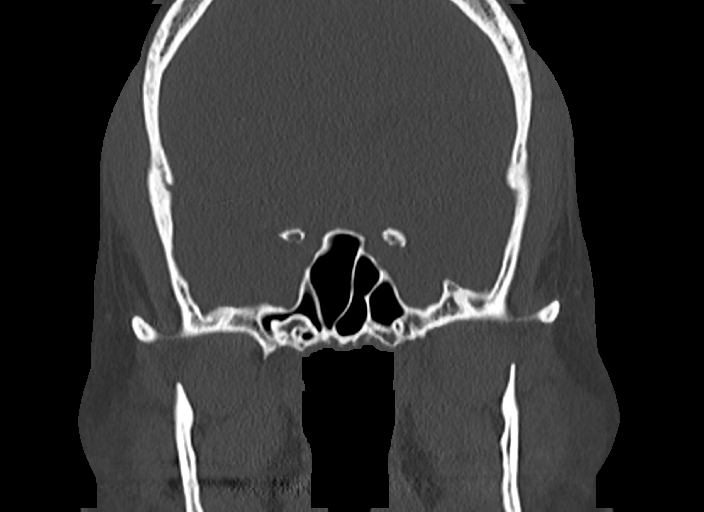
[im 72/129  bone]
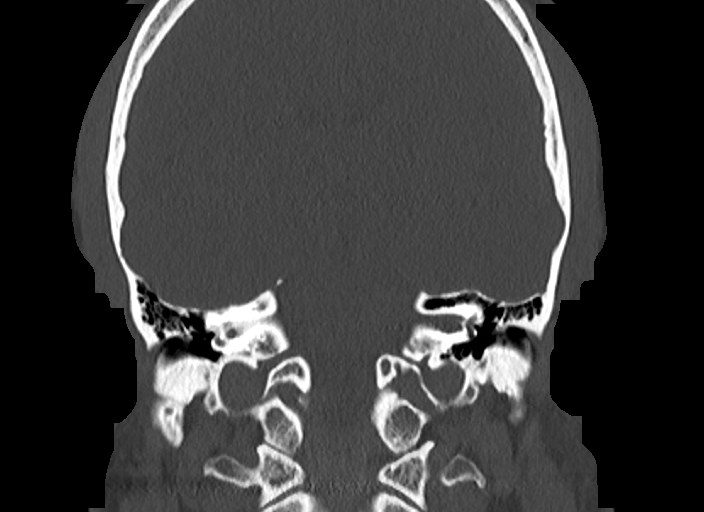

[Series 6: sinus 2.00 hr60 s3 sag · sagittal · 0.30mm/px · 3 of 105 slices shown]
[im 35/105  bone]
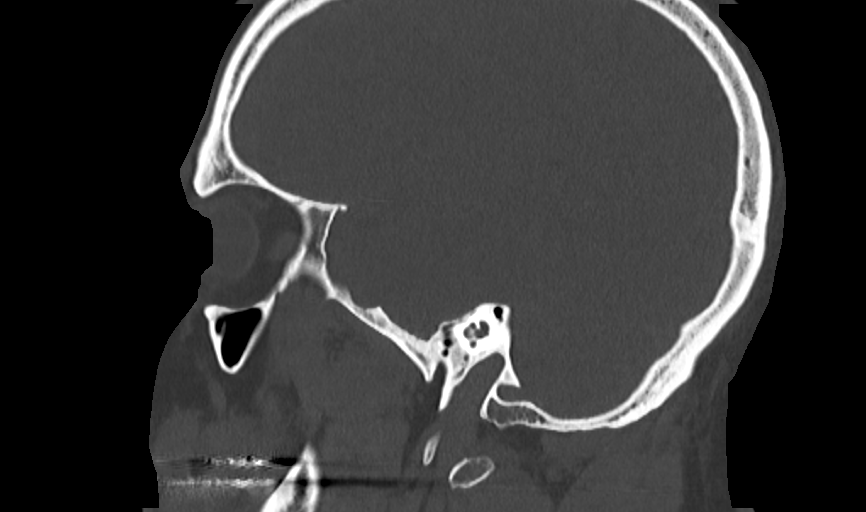
[im 53/105  bone]
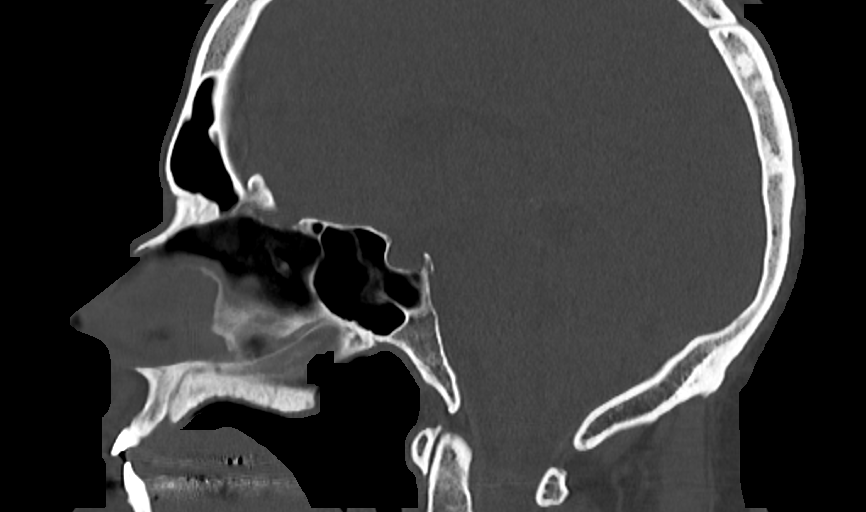
[im 70/105  bone]
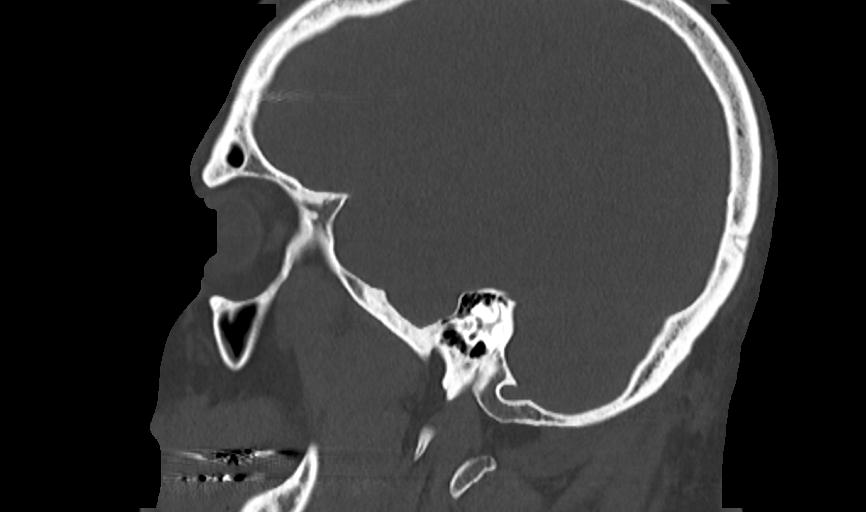

[Series 12: sinus 1.00 hr60 s3 axial fusion thins · axial · 0.51mm/px · z∈[-617,-499]mm · 8 of 254 slices shown, 10 images]
[im 29/254  brain]
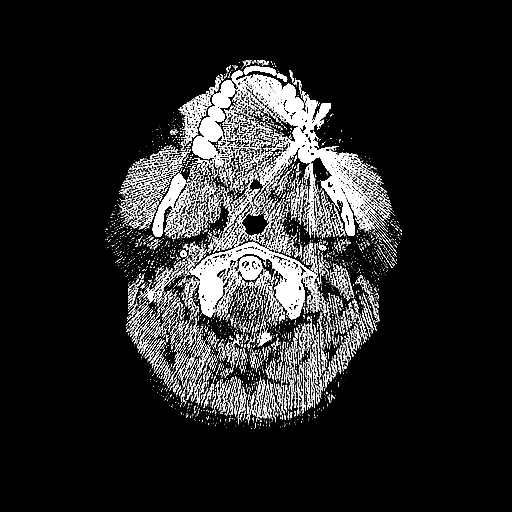
[im 29/254  bone]
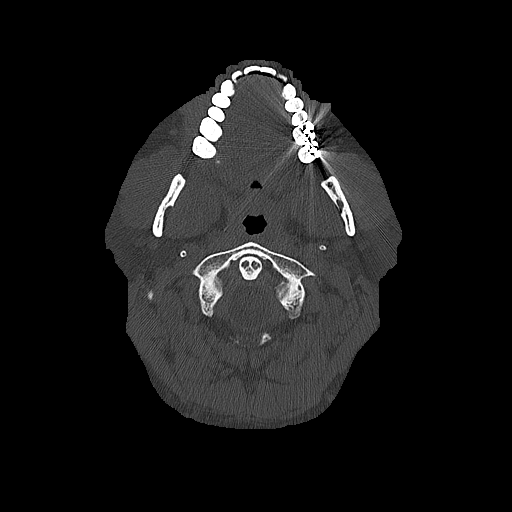
[im 57/254  bone]
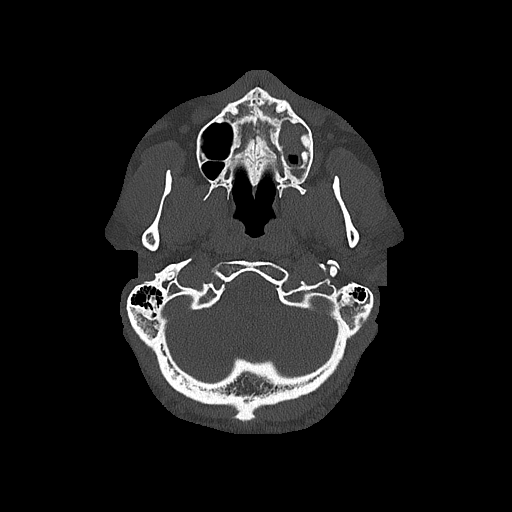
[im 85/254  bone]
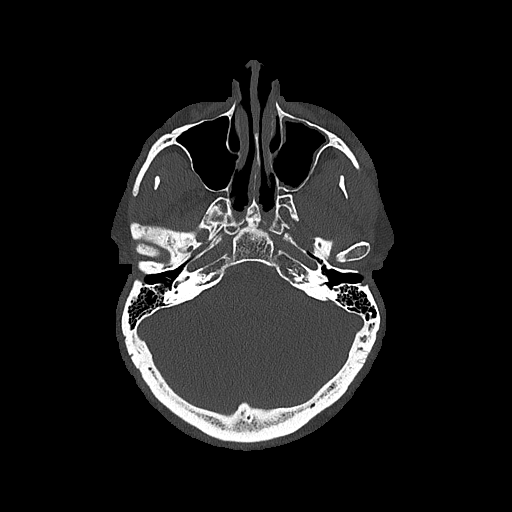
[im 113/254  bone]
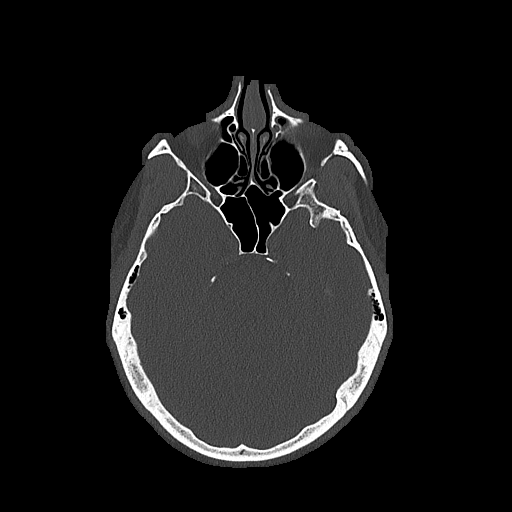
[im 141/254  brain]
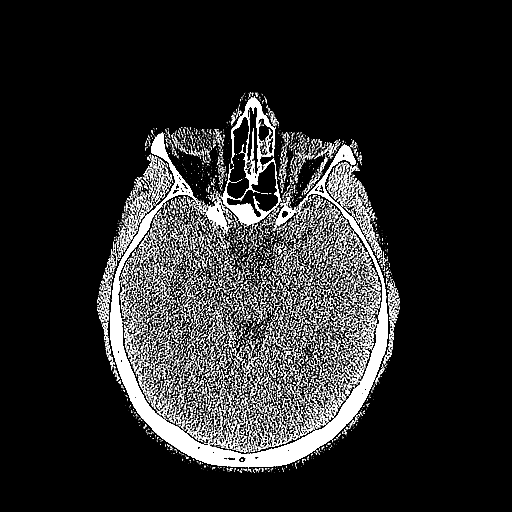
[im 141/254  bone]
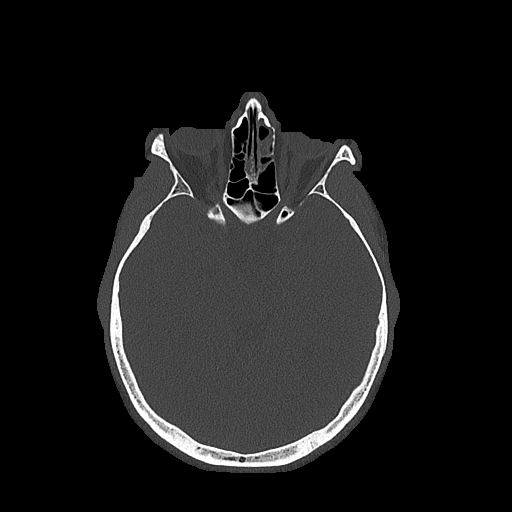
[im 169/254  bone]
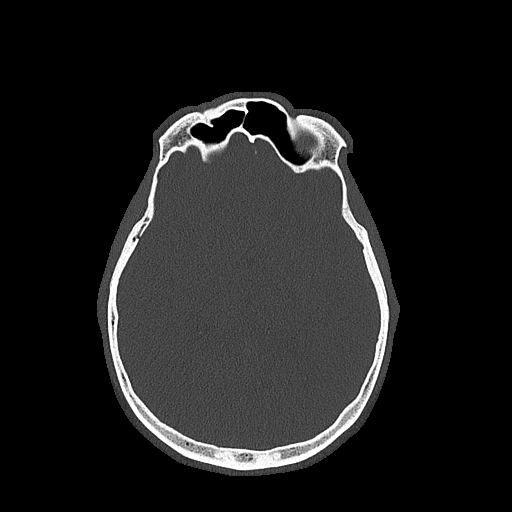
[im 197/254  bone]
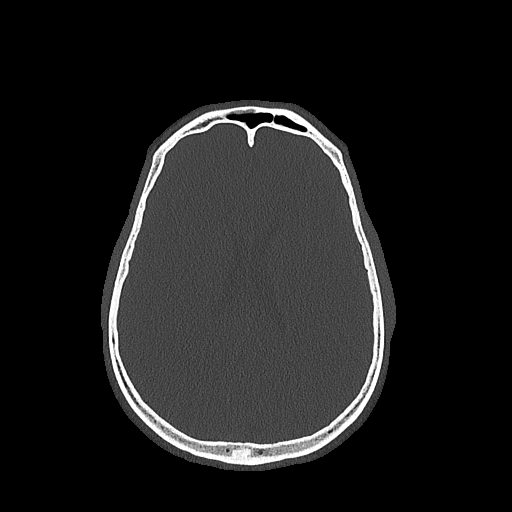
[im 225/254  bone]
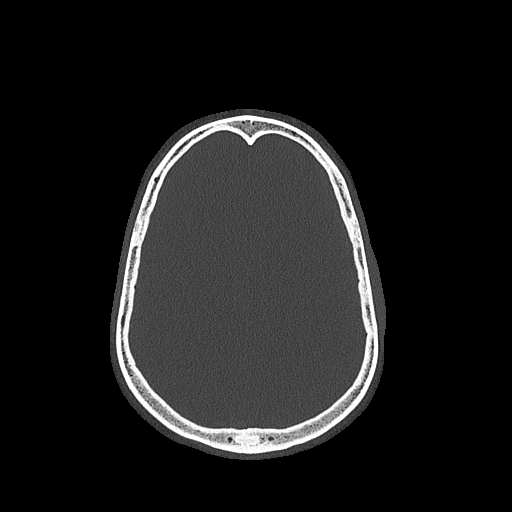

[14 of 47 positions shown; findings below may reference images not displayed]

FINDINGS: Paranasal sinuses:

Frontal: Clear except for mucosal thickening at the left frontal
ethmoid junction.

Ethmoid: Right ethmoids are clear. Moderate opacification of the
anterior ethmoid region on the left.

Maxillary: Right maxillary sinus is clear. Mild mucosal thickening
of the left maxillary sinus without layering fluid. Retention cyst
at the maxillary sinus floor.

Sphenoid: Normally aerated. Patent sphenoethmoidal recesses.

Right ostiomeatal unit: Infundibulum is narrow but patent.

Left ostiomeatal unit: Infundibulum is obscured by mucosal
thickening. There may be mild stenosis at the ostium.

Nasal passages: Patent. Intact nasal septum is midline.

Anatomy: Minor pneumatization superior to the anterior ethmoid
notches. Olfactory groove is broader on the right than the left.
Depth is approximately 8-9 mm. Sellar sphenoid pneumatization
pattern. No dehiscence of carotid or optic canals. No onodi cell.

Other: None
IMPRESSION: 1. Moderate opacification of the anterior ethmoid region on the
left. Mild mucosal thickening at the left frontal ethmoid junction.
2. Mild mucosal thickening of the left maxillary sinus without
layering fluid. Retention cyst at the floor of the left maxillary
sinus. Narrowing of the ostiomeatal complex on the left because of
mucosal thickening and possible ostial stenosis.

## 2020-03-18 ENCOUNTER — Telehealth: Payer: Self-pay

## 2020-03-18 NOTE — Telephone Encounter (Signed)
He needs an appointment but do not let him run out of the medication.

## 2020-03-18 NOTE — Telephone Encounter (Signed)
Pt. Called stating he needs a refill on his testosterone but he usually needs to have his testosterone levels checked. He said he is in town this week if he could come in this week to have that checked. He also mentioned that he need a blood test done to check for an allergy to red meat if he could have that checked here at the same time that would be good. Pt. Last apt was 04/17/19. Testosterone refill goes to [facility].

## 2020-03-19 NOTE — Telephone Encounter (Signed)
Done KH 

## 2020-03-21 ENCOUNTER — Encounter: Payer: Self-pay | Admitting: Family Medicine

## 2020-03-21 ENCOUNTER — Ambulatory Visit (INDEPENDENT_AMBULATORY_CARE_PROVIDER_SITE_OTHER): Payer: Self-pay | Admitting: Family Medicine

## 2020-03-21 ENCOUNTER — Other Ambulatory Visit: Payer: Self-pay

## 2020-03-21 VITALS — BP 108/72 | HR 87 | Temp 98.3°F | Wt 231.2 lb

## 2020-03-21 DIAGNOSIS — E291 Testicular hypofunction: Secondary | ICD-10-CM

## 2020-03-21 NOTE — Progress Notes (Signed)
   Subjective:    Patient ID: Craig Byrd, male    DOB: October 23, 1965, 54 y.o.   MRN: [id number]  HPI He is here for recheck on his testosterone.  He is now using 5 clicks per day.  He cannot really tell the difference.  He has had some underlying allergies and at one point did get checked for alpha gal and the testing was positive however he has been eating red meats with no evidence of lymphedema other than occasional uvular swelling.  He cannot relate this timing wise to any particular foods and it tends to go away very quickly.  He has not noted edema anywhere else.   Review of Systems     Objective:   Physical Exam Alert and in no distress otherwise not examined       Assessment & Plan:  Hypogonadism in male - Plan: Testosterone I will check his level and proceed as needed. I discussed the possibility of alpha gal and doing further testing.  Since he is really not having anything other than occasional uvular swelling that goes away very quickly and no even remote association with eating anything in particular, I think it is okay to not pursue that.  He was comfortable with that.

## 2020-03-22 LAB — TESTOSTERONE: Testosterone: 387 ng/dL (ref 264–916)

## 2020-04-03 ENCOUNTER — Encounter: Payer: Self-pay | Admitting: Family Medicine

## 2020-04-03 DIAGNOSIS — K1379 Other lesions of oral mucosa: Secondary | ICD-10-CM

## 2020-04-03 NOTE — Telephone Encounter (Signed)
Refer him to an allergist to get this looked at.

## 2020-04-13 ENCOUNTER — Other Ambulatory Visit: Payer: No Typology Code available for payment source

## 2020-05-16 ENCOUNTER — Telehealth: Payer: Self-pay

## 2020-05-16 ENCOUNTER — Encounter: Payer: Self-pay | Admitting: Family Medicine

## 2020-05-16 ENCOUNTER — Telehealth (INDEPENDENT_AMBULATORY_CARE_PROVIDER_SITE_OTHER): Payer: Self-pay | Admitting: Family Medicine

## 2020-05-16 ENCOUNTER — Other Ambulatory Visit: Payer: Self-pay

## 2020-05-16 VITALS — Temp 98.6°F | Ht 72.0 in | Wt 231.0 lb

## 2020-05-16 DIAGNOSIS — U071 COVID-19: Secondary | ICD-10-CM

## 2020-05-16 NOTE — Progress Notes (Signed)
   Subjective:    Patient ID: Craig Byrd, male    DOB: [birth year], 55 y.o.   MRN: [id number]  HPI Documentation for virtual audio and video telecommunications through [facility] encounter: The patient was located at home. 2 patient identifiers used.  The provider was located in the office. The patient did consent to this visit and is aware of possible charges through their insurance for this visit. The other persons participating in this telemedicine service were none. Time spent on call was 5 minutes and in review of previous records >20 minutes total for counseling and coordination of care. This virtual service is not related to other E/M service within previous 7 days. He is here for consult concerning a recent Covid test positivity.  He started having symptoms of sinus congestion slight sore throat, postnasal drainage and upper tooth discomfort and was Covid tested to be positive.  He was also treated for a sinus infection as well.  At this time he is totally asymptomatic.  He does travel a lot and will need a note for travel purposes.  Also of note is the fact that he had Covid in December 2020.  He has had 2 injections but has not gotten the booster yet.  Review of Systems     Objective:   Physical Exam Alert and in no distress otherwise not examined       Assessment & Plan:  COVID-19 virus infection Since he travels I will write a letter stating that he was Covid positive on May 04, 2020 and now asymptomatic and excusing him for the next 90 days to have any testing done.  Also discussed getting the booster in essence just to have proper documentation for travel

## 2020-05-16 NOTE — Telephone Encounter (Signed)
Letter typed and called pt he will pick up original and emailed copy to patient

## 2020-06-03 ENCOUNTER — Telehealth: Payer: Self-pay | Admitting: Family Medicine

## 2020-06-03 NOTE — Telephone Encounter (Signed)
[facility] sent refill request for amlodipine 5 mg and lisinopril-hctz 20-12.5mg   Please send to the  [facility] [address] [place]

## 2020-06-04 ENCOUNTER — Other Ambulatory Visit: Payer: Self-pay

## 2020-06-04 MED ORDER — LISINOPRIL-HYDROCHLOROTHIAZIDE 20-12.5 MG PO TABS: 1.0000 | ORAL_TABLET | Freq: Every day | ORAL | 1 refills | Status: DC

## 2020-06-04 MED ORDER — AMLODIPINE BESYLATE 5 MG PO TABS: 5.0000 mg | ORAL_TABLET | Freq: Every day | ORAL | 1 refills | Status: DC

## 2020-06-04 NOTE — Telephone Encounter (Signed)
Done KH 

## 2020-08-02 ENCOUNTER — Telehealth: Payer: Self-pay | Admitting: Family Medicine

## 2020-08-02 MED ORDER — LISINOPRIL-HYDROCHLOROTHIAZIDE 20-12.5 MG PO TABS: 1.0000 | ORAL_TABLET | Freq: Every day | ORAL | 0 refills | Status: DC

## 2020-08-02 NOTE — Telephone Encounter (Signed)
Sent in med

## 2020-08-02 NOTE — Telephone Encounter (Signed)
Pt's wife called and states that they are out of town and pt left Lisinopril HCTZ at home. Please call into [facility] at [place]. [address]. She can be reached at [phone].

## 2020-08-08 ENCOUNTER — Other Ambulatory Visit: Payer: Self-pay

## 2020-08-08 ENCOUNTER — Ambulatory Visit (INDEPENDENT_AMBULATORY_CARE_PROVIDER_SITE_OTHER): Payer: Self-pay | Admitting: Family Medicine

## 2020-08-08 VITALS — BP 126/78 | HR 77 | Temp 98.3°F | Ht 72.75 in | Wt 234.6 lb

## 2020-08-08 DIAGNOSIS — Z13220 Encounter for screening for lipoid disorders: Secondary | ICD-10-CM

## 2020-08-08 DIAGNOSIS — J3089 Other allergic rhinitis: Secondary | ICD-10-CM

## 2020-08-08 DIAGNOSIS — Z1159 Encounter for screening for other viral diseases: Secondary | ICD-10-CM

## 2020-08-08 DIAGNOSIS — E291 Testicular hypofunction: Secondary | ICD-10-CM

## 2020-08-08 DIAGNOSIS — N529 Male erectile dysfunction, unspecified: Secondary | ICD-10-CM

## 2020-08-08 DIAGNOSIS — I10 Essential (primary) hypertension: Secondary | ICD-10-CM

## 2020-08-08 DIAGNOSIS — Z860100 Personal history of colon polyps, unspecified: Secondary | ICD-10-CM | POA: Insufficient documentation

## 2020-08-08 DIAGNOSIS — Z8616 Personal history of COVID-19: Secondary | ICD-10-CM

## 2020-08-08 DIAGNOSIS — Z125 Encounter for screening for malignant neoplasm of prostate: Secondary | ICD-10-CM

## 2020-08-08 MED ORDER — TADALAFIL 20 MG PO TABS: 20.0000 mg | ORAL_TABLET | Freq: Every day | ORAL | 2 refills | Status: DC | PRN

## 2020-08-08 NOTE — Progress Notes (Signed)
   Subjective:    Patient ID: Craig Byrd, male    DOB: [birth year], 55 y.o.   MRN: [id number]  HPI He is here for med check appointment.  He continues on testosterone and is having no difficulty with that.  His allergies seem to be under good control.  He would like a refill on his Cialis.  He does have a history of colonic polyps and is scheduled for routine follow-up concerning that.  His blood pressure seems to be under good control on his present medication regimen.  He apparently has had COVID diagnosed on 2 separate occasions.  He has also had 2 immunizations. He has no other concerns or complaints.  Review of Systems     Objective:   Physical Exam Alert and in no distress. Tympanic membranes and canals are normal. Pharyngeal area is normal. Neck is supple without adenopathy or thyromegaly. Cardiac exam shows a regular sinus rhythm without murmurs or gallops. Lungs are clear to auscultation.        Assessment & Plan:  Hypogonadism in male - Plan: Testosterone, PSA  Essential hypertension - Plan: CBC with Differential/Platelet, Comprehensive metabolic panel  Screening for prostate cancer - Plan: PSA  History of colonic polyps  Erectile dysfunction, unspecified erectile dysfunction type - Plan: tadalafil (CIALIS) 20 MG tablet  Need for hepatitis C screening test - Plan: Hepatitis C antibody  Screening for lipid disorders - Plan: Lipid panel  Perennial allergic rhinitis  Primary hypertension  History of COVID-19 Continue on his present medication regimen.  He will schedule for complete exam in about 6 months.

## 2020-08-09 LAB — COMPREHENSIVE METABOLIC PANEL
ALT: 32 IU/L (ref 0–44)
AST: 22 IU/L (ref 0–40)
Albumin/Globulin Ratio: 2.6 — ABNORMAL HIGH (ref 1.2–2.2)
Albumin: 5.1 g/dL — ABNORMAL HIGH (ref 3.8–4.9)
Alkaline Phosphatase: 55 IU/L (ref 44–121)
BUN/Creatinine Ratio: 17 (ref 9–20)
BUN: 20 mg/dL (ref 6–24)
Bilirubin Total: 0.5 mg/dL (ref 0.0–1.2)
CO2: 24 mmol/L (ref 20–29)
Calcium: 9.9 mg/dL (ref 8.7–10.2)
Chloride: 99 mmol/L (ref 96–106)
Creatinine, Ser: 1.19 mg/dL (ref 0.76–1.27)
Globulin, Total: 2 g/dL (ref 1.5–4.5)
Glucose: 80 mg/dL (ref 65–99)
Potassium: 4.1 mmol/L (ref 3.5–5.2)
Sodium: 140 mmol/L (ref 134–144)
Total Protein: 7.1 g/dL (ref 6.0–8.5)
eGFR: 73 mL/min/{1.73_m2} (ref >59)

## 2020-08-09 LAB — PSA: Prostate Specific Ag, Serum: 1.2 ng/mL (ref 0.0–4.0)

## 2020-08-09 LAB — CBC WITH DIFFERENTIAL/PLATELET
Basophils Absolute: 0 10*3/uL (ref 0.0–0.2)
Basos: 1 %
EOS (ABSOLUTE): 0.2 10*3/uL (ref 0.0–0.4)
Eos: 2 %
Hematocrit: 50.8 % (ref 37.5–51.0)
Hemoglobin: 16.8 g/dL (ref 13.0–17.7)
Immature Grans (Abs): 0 10*3/uL (ref 0.0–0.1)
Immature Granulocytes: 0 %
Lymphocytes Absolute: 1.8 10*3/uL (ref 0.7–3.1)
Lymphs: 25 %
MCH: 27.4 pg (ref 26.6–33.0)
MCHC: 33.1 g/dL (ref 31.5–35.7)
MCV: 83 fL (ref 79–97)
Monocytes Absolute: 0.7 10*3/uL (ref 0.1–0.9)
Monocytes: 10 %
Neutrophils Absolute: 4.6 10*3/uL (ref 1.4–7.0)
Neutrophils: 62 %
Platelets: 304 10*3/uL (ref 150–450)
RBC: 6.14 x10E6/uL — ABNORMAL HIGH (ref 4.14–5.80)
RDW: 12.9 % (ref 11.6–15.4)
WBC: 7.4 10*3/uL (ref 3.4–10.8)

## 2020-08-09 LAB — LIPID PANEL
Chol/HDL Ratio: 6.3 ratio — ABNORMAL HIGH (ref 0.0–5.0)
Cholesterol, Total: 209 mg/dL — ABNORMAL HIGH (ref 100–199)
HDL: 33 mg/dL — ABNORMAL LOW (ref >39)
LDL Chol Calc (NIH): 135 mg/dL — ABNORMAL HIGH (ref 0–99)
Triglycerides: 228 mg/dL — ABNORMAL HIGH (ref 0–149)
VLDL Cholesterol Cal: 41 mg/dL — ABNORMAL HIGH (ref 5–40)

## 2020-08-09 LAB — TESTOSTERONE: Testosterone: 335 ng/dL (ref 264–916)

## 2020-08-09 LAB — HEPATITIS C ANTIBODY: Hep C Virus Ab: 0.2 s/co ratio (ref 0.0–0.9)

## 2020-10-18 ENCOUNTER — Other Ambulatory Visit: Payer: Self-pay | Admitting: Internal Medicine

## 2020-10-18 DIAGNOSIS — E291 Testicular hypofunction: Secondary | ICD-10-CM

## 2020-10-18 NOTE — Telephone Encounter (Signed)
[facility] called and needs a refill for pt on his Testosterone 6% with 1% DHEA compounded combo. Apply 5 clicks once a day. Quantity- 62ml

## 2020-10-21 MED ORDER — NONFORMULARY OR COMPOUNDED ITEM: 1 refills | Status: DC

## 2020-10-21 NOTE — Telephone Encounter (Signed)
Done KH 

## 2020-12-02 ENCOUNTER — Other Ambulatory Visit: Payer: Self-pay

## 2020-12-02 MED ORDER — LISINOPRIL-HYDROCHLOROTHIAZIDE 20-12.5 MG PO TABS: 1.0000 | ORAL_TABLET | Freq: Every day | ORAL | 1 refills | Status: DC

## 2020-12-02 MED ORDER — AMLODIPINE BESYLATE 5 MG PO TABS: 5.0000 mg | ORAL_TABLET | Freq: Every day | ORAL | 1 refills | Status: DC

## 2020-12-09 ENCOUNTER — Encounter: Payer: Self-pay | Admitting: Family Medicine

## 2021-03-03 ENCOUNTER — Telehealth: Payer: Self-pay

## 2021-03-03 NOTE — Telephone Encounter (Signed)
Called in for pt . Craig Byrd

## 2021-03-03 NOTE — Telephone Encounter (Signed)
[facility] left message that pt needs refill Testosterone 6 % wants 2 months because it is much cheaper t# [phone].  Said e-scribe was denied saying not appropriate but pt is out.

## 2021-03-07 ENCOUNTER — Telehealth: Payer: Self-pay

## 2021-03-07 NOTE — Telephone Encounter (Signed)
done

## 2021-03-07 NOTE — Telephone Encounter (Signed)
Per Jcl pt can have testosterone for 90 and 1 refill to cover him until his appt. [place]

## 2021-05-07 ENCOUNTER — Ambulatory Visit (INDEPENDENT_AMBULATORY_CARE_PROVIDER_SITE_OTHER): Payer: Self-pay | Admitting: Family Medicine

## 2021-05-07 VITALS — BP 122/78 | HR 84 | Temp 97.7°F | Wt 234.2 lb

## 2021-05-07 DIAGNOSIS — Z13220 Encounter for screening for lipoid disorders: Secondary | ICD-10-CM

## 2021-05-07 DIAGNOSIS — N529 Male erectile dysfunction, unspecified: Secondary | ICD-10-CM

## 2021-05-07 DIAGNOSIS — I10 Essential (primary) hypertension: Secondary | ICD-10-CM

## 2021-05-07 DIAGNOSIS — J3089 Other allergic rhinitis: Secondary | ICD-10-CM

## 2021-05-07 DIAGNOSIS — E291 Testicular hypofunction: Secondary | ICD-10-CM

## 2021-05-07 DIAGNOSIS — Z125 Encounter for screening for malignant neoplasm of prostate: Secondary | ICD-10-CM

## 2021-05-07 DIAGNOSIS — Z86010 Personal history of colonic polyps: Secondary | ICD-10-CM

## 2021-05-07 LAB — LIPID PANEL

## 2021-05-07 NOTE — Patient Instructions (Addendum)
To help prevent hemorrhoids its fluids, bulk your diet, exercise and listening to your body.  To treated warm tub baths or stand in the shower and let the water run over the area.  You can use Tucks

## 2021-05-07 NOTE — Progress Notes (Signed)
° °  Subjective:    Patient ID: Craig Byrd, male    DOB: [birth year], 56 y.o.   MRN: [id number]  HPI Is here for a recheck.  He continues on Xyzal for his allergies and is doing well on that.  He is also taking lisinopril/HCTZ and amlodipine for blood pressure and having no difficulty with that.  He is also on topical testosterone.  He does have reflux disease and is taking Prilosec daily.  He has tried to take it as needed and has not been successful.  He does have a history of colonic polyps over the last colonoscopy showed hyperplastic polyps.  Does have underlying ED and is using Cialis on an as-needed basis.   Review of Systems     Objective:   Physical Exam Alert and in no distress. Tympanic membranes and canals are normal. Pharyngeal area is normal. Neck is supple without adenopathy or thyromegaly. Cardiac exam shows a regular sinus rhythm without murmurs or gallops. Lungs are clear to auscultation.        Assessment & Plan:  Hypogonadism in male - Plan: Testosterone, CBC with Differential/Platelet, Comprehensive metabolic panel  Screening for prostate cancer - Plan: PSA  History of colonic polyps  Essential hypertension - Plan: CBC with Differential/Platelet, Comprehensive metabolic panel  Erectile dysfunction, unspecified erectile dysfunction type  Perennial allergic rhinitis  Screening for lipid disorders - Plan: Lipid panel I will do routine blood screening on him including testosterone and PSA and follow-up.  He apparently has a follow-up visit exam scheduled in several months.

## 2021-05-08 LAB — LIPID PANEL
Chol/HDL Ratio: 5.8 ratio — ABNORMAL HIGH (ref 0.0–5.0)
Cholesterol, Total: 198 mg/dL (ref 100–199)
HDL: 34 mg/dL — ABNORMAL LOW (ref >39)
LDL Chol Calc (NIH): 119 mg/dL — ABNORMAL HIGH (ref 0–99)
Triglycerides: 258 mg/dL — ABNORMAL HIGH (ref 0–149)
VLDL Cholesterol Cal: 45 mg/dL — ABNORMAL HIGH (ref 5–40)

## 2021-05-08 LAB — COMPREHENSIVE METABOLIC PANEL
ALT: 35 IU/L (ref 0–44)
AST: 27 IU/L (ref 0–40)
Albumin/Globulin Ratio: 2.4 — ABNORMAL HIGH (ref 1.2–2.2)
Albumin: 5 g/dL — ABNORMAL HIGH (ref 3.8–4.9)
Alkaline Phosphatase: 61 IU/L (ref 44–121)
BUN/Creatinine Ratio: 15 (ref 9–20)
BUN: 17 mg/dL (ref 6–24)
Bilirubin Total: 0.5 mg/dL (ref 0.0–1.2)
CO2: 25 mmol/L (ref 20–29)
Calcium: 9.9 mg/dL (ref 8.7–10.2)
Chloride: 99 mmol/L (ref 96–106)
Creatinine, Ser: 1.12 mg/dL (ref 0.76–1.27)
Globulin, Total: 2.1 g/dL (ref 1.5–4.5)
Glucose: 100 mg/dL — ABNORMAL HIGH (ref 70–99)
Potassium: 4.1 mmol/L (ref 3.5–5.2)
Sodium: 141 mmol/L (ref 134–144)
Total Protein: 7.1 g/dL (ref 6.0–8.5)
eGFR: 79 mL/min/{1.73_m2} (ref >59)

## 2021-05-08 LAB — PSA: Prostate Specific Ag, Serum: 1.6 ng/mL (ref 0.0–4.0)

## 2021-05-08 LAB — CBC WITH DIFFERENTIAL/PLATELET
Basophils Absolute: 0 10*3/uL (ref 0.0–0.2)
Basos: 1 %
EOS (ABSOLUTE): 0.2 10*3/uL (ref 0.0–0.4)
Eos: 2 %
Hematocrit: 46.4 % (ref 37.5–51.0)
Hemoglobin: 15.9 g/dL (ref 13.0–17.7)
Immature Grans (Abs): 0 10*3/uL (ref 0.0–0.1)
Immature Granulocytes: 0 %
Lymphocytes Absolute: 1.7 10*3/uL (ref 0.7–3.1)
Lymphs: 24 %
MCH: 27.7 pg (ref 26.6–33.0)
MCHC: 34.3 g/dL (ref 31.5–35.7)
MCV: 81 fL (ref 79–97)
Monocytes Absolute: 0.5 10*3/uL (ref 0.1–0.9)
Monocytes: 7 %
Neutrophils Absolute: 4.4 10*3/uL (ref 1.4–7.0)
Neutrophils: 66 %
Platelets: 322 10*3/uL (ref 150–450)
RBC: 5.75 x10E6/uL (ref 4.14–5.80)
RDW: 12.7 % (ref 11.6–15.4)
WBC: 6.8 10*3/uL (ref 3.4–10.8)

## 2021-05-08 LAB — TESTOSTERONE: Testosterone: 127 ng/dL — ABNORMAL LOW (ref 264–916)

## 2021-05-08 NOTE — Addendum Note (Signed)
Addended by: Denita Lung on: 05/08/2021 09:54 AM   Modules accepted: Orders

## 2021-05-28 ENCOUNTER — Other Ambulatory Visit: Payer: Self-pay

## 2021-05-28 MED ORDER — AMLODIPINE BESYLATE 5 MG PO TABS: 5.0000 mg | ORAL_TABLET | Freq: Every day | ORAL | 1 refills | Status: DC

## 2021-05-28 MED ORDER — LISINOPRIL-HYDROCHLOROTHIAZIDE 20-12.5 MG PO TABS: 1.0000 | ORAL_TABLET | Freq: Every day | ORAL | 1 refills | Status: DC

## 2021-06-03 ENCOUNTER — Other Ambulatory Visit: Payer: Self-pay

## 2021-06-03 DIAGNOSIS — E291 Testicular hypofunction: Secondary | ICD-10-CM

## 2021-06-04 ENCOUNTER — Other Ambulatory Visit: Payer: Self-pay

## 2021-06-04 LAB — TESTOSTERONE: Testosterone: 313 ng/dL (ref 264–916)

## 2021-06-11 ENCOUNTER — Encounter: Payer: Self-pay | Admitting: Family Medicine

## 2021-06-11 DIAGNOSIS — E291 Testicular hypofunction: Secondary | ICD-10-CM

## 2021-06-11 MED ORDER — NONFORMULARY OR COMPOUNDED ITEM: 1 refills | Status: DC

## 2021-06-11 NOTE — Telephone Encounter (Signed)
Looks like there was never a refill request from pharmacy.  This is a compounded item.  Okay to call in 2 month supply as pt requests.  He has f/u with JCL in April. ?

## 2021-07-14 ENCOUNTER — Ambulatory Visit (INDEPENDENT_AMBULATORY_CARE_PROVIDER_SITE_OTHER): Payer: Self-pay | Admitting: Family Medicine

## 2021-07-14 ENCOUNTER — Encounter: Payer: Self-pay | Admitting: Family Medicine

## 2021-07-14 ENCOUNTER — Telehealth: Payer: Self-pay

## 2021-07-14 VITALS — BP 126/82 | HR 80 | Temp 97.3°F | Ht 71.5 in | Wt 236.0 lb

## 2021-07-14 DIAGNOSIS — J3089 Other allergic rhinitis: Secondary | ICD-10-CM

## 2021-07-14 DIAGNOSIS — E291 Testicular hypofunction: Secondary | ICD-10-CM

## 2021-07-14 DIAGNOSIS — Z0000 Encounter for general adult medical examination without abnormal findings: Secondary | ICD-10-CM

## 2021-07-14 DIAGNOSIS — Z86010 Personal history of colonic polyps: Secondary | ICD-10-CM

## 2021-07-14 DIAGNOSIS — I10 Essential (primary) hypertension: Secondary | ICD-10-CM

## 2021-07-14 DIAGNOSIS — N529 Male erectile dysfunction, unspecified: Secondary | ICD-10-CM

## 2021-07-14 DIAGNOSIS — H1013 Acute atopic conjunctivitis, bilateral: Secondary | ICD-10-CM

## 2021-07-14 DIAGNOSIS — Z91018 Allergy to other foods: Secondary | ICD-10-CM

## 2021-07-14 MED ORDER — NON FORMULARY: 150.0000 mg | Freq: Every day | Status: DC

## 2021-07-14 MED ORDER — LISINOPRIL-HYDROCHLOROTHIAZIDE 20-12.5 MG PO TABS: 1.0000 | ORAL_TABLET | Freq: Every day | ORAL | 1 refills | Status: DC

## 2021-07-14 MED ORDER — NONFORMULARY OR COMPOUNDED ITEM: 1 refills | Status: DC

## 2021-07-14 MED ORDER — AMLODIPINE BESYLATE 5 MG PO TABS: 5.0000 mg | ORAL_TABLET | Freq: Every day | ORAL | 1 refills | Status: DC

## 2021-07-14 MED ORDER — LEVOCETIRIZINE DIHYDROCHLORIDE 5 MG PO TABS: 5.0000 mg | ORAL_TABLET | Freq: Every evening | ORAL | 5 refills | Status: AC

## 2021-07-14 NOTE — Telephone Encounter (Signed)
Med called in per Dr. Redmond School. [place] ?

## 2021-07-14 NOTE — Progress Notes (Signed)
? ?  Subjective:  ? ? Patient ID: Craig Byrd, male    DOB: 03-28-1966, 56 y.o.   MRN: [id number] ? ?HPI ?He is here for complete examination.  He continues on amlodipine and lisinopril/HCTZ and not having difficulty with that.  He is also using Cialis on an as-needed basis.  His allergies seem to be under good control with the use of Xyzal.  He is using Prilosec to help with his reflux and has tried to cut back on use but has been unable to do it.  He does have a history of colonic polyp.  He continues on topical use of testosterone.  His most recent level was slightly above 300.  He also apparently has a history of alpha gal ? ? ?Review of Systems  ?All other systems reviewed and are negative. ? ?   ?Objective:  ? Physical Exam ?Alert and in no distress. Tympanic membranes and canals are normal. Pharyngeal area is normal. Neck is supple without adenopathy or thyromegaly. Cardiac exam shows a regular sinus rhythm without murmurs or gallops. Lungs are clear to auscultation. ? ? ? ? ?   ?Assessment & Plan:  ?Routine general medical examination at a health care facility ? ?Hypogonadism in male - Plan: NONFORMULARY OR COMPOUNDED ITEM ? ?History of colonic polyps ? ?Essential hypertension - Plan: lisinopril-hydrochlorothiazide (ZESTORETIC) 20-12.5 MG tablet, amLODipine (NORVASC) 5 MG tablet ? ?Erectile dysfunction, unspecified erectile dysfunction type ? ?Perennial allergic rhinitis ? ?Primary hypertension ? ?Allergy to alpha-gal ? ?Perennial allergic rhinitis with a possible nonallergic component - Plan: levocetirizine (XYZAL) 5 MG tablet ? ?Allergic conjunctivitis of both eyes - Plan: levocetirizine (XYZAL) 5 MG tablet ?His medications were renewed.  Since his testosterone is low normal, I will double the dosing to equivalent of 500 mg/day.  Topical per day  ? ?

## 2021-07-18 ENCOUNTER — Telehealth: Payer: Self-pay

## 2021-07-18 NOTE — Telephone Encounter (Signed)
Pt was advised that form was completed and we still need vision , field, color eye exam. Pt will come by and this will need to be done. Form is at my desk in the yellow hold folder  by my elephant and a copy will be needed for his record. [place] ?

## 2021-07-28 ENCOUNTER — Encounter: Payer: Self-pay | Admitting: Family Medicine

## 2021-09-18 ENCOUNTER — Encounter: Payer: Self-pay | Admitting: Family Medicine

## 2021-10-28 ENCOUNTER — Ambulatory Visit (INDEPENDENT_AMBULATORY_CARE_PROVIDER_SITE_OTHER): Payer: Self-pay | Admitting: Family Medicine

## 2021-10-28 VITALS — BP 114/76 | HR 88 | Temp 98.6°F | Wt 239.2 lb

## 2021-10-28 DIAGNOSIS — E669 Obesity, unspecified: Secondary | ICD-10-CM

## 2021-10-28 DIAGNOSIS — N529 Male erectile dysfunction, unspecified: Secondary | ICD-10-CM

## 2021-10-29 NOTE — Progress Notes (Signed)
   Subjective:    Patient ID: Craig Byrd, male    DOB: 1965-12-09, 56 y.o.   MRN: [id number]  HPI He is here for consult concerning multiple issues.  He does have underlying ADD and has tried Cialis with minimal effect.  He has also tried Viagra and states that both of these medications also cause headache as well as nasal congestion which is unacceptable to him.  He notes that in the past he was diagnosed with ADD and that the med tend to help him with weight loss but at this point he is not interested in pursuing that.  He also is interested in pursuing weight loss.  At the present time he does not have insurance.  I pointed out the fact that we go we in particular is going to be probably close to $1000 per month.   Review of Systems     Objective:   Physical Exam Alert and in no distress otherwise not examined       Assessment & Plan:  Erectile dysfunction, unspecified erectile dysfunction type  Obesity (BMI 30.0-34.9) I explained that at this point the only thing can be done for the ED would be to refer to urology for their input and described other modalities including injections.  He will let me know when he wants that done.  I then discussed weight loss with him in terms of diet and exercise especially cutting back on carbohydrates.

## 2021-11-21 ENCOUNTER — Other Ambulatory Visit: Payer: Self-pay | Admitting: Family Medicine

## 2021-11-21 DIAGNOSIS — N529 Male erectile dysfunction, unspecified: Secondary | ICD-10-CM

## 2021-11-21 NOTE — Telephone Encounter (Signed)
Left message for pt to call back to see if he needs this refilled. Original rx was 08/2020

## 2021-11-21 NOTE — Telephone Encounter (Signed)
Pt would like a refill on this medication

## 2021-11-22 ENCOUNTER — Other Ambulatory Visit: Payer: Self-pay | Admitting: Family Medicine

## 2021-11-22 DIAGNOSIS — I10 Essential (primary) hypertension: Secondary | ICD-10-CM

## 2021-12-10 ENCOUNTER — Encounter: Payer: Self-pay | Admitting: Internal Medicine

## 2022-01-13 ENCOUNTER — Encounter: Payer: Self-pay | Admitting: Internal Medicine

## 2022-01-23 ENCOUNTER — Telehealth: Payer: Self-pay

## 2022-01-23 NOTE — Telephone Encounter (Signed)
Received a fax from [facility] for a refill on the pts. Testosterone 12% '120mg'$ /ml gel last apt was 10/28/21.

## 2022-01-26 ENCOUNTER — Encounter: Payer: Self-pay | Admitting: Internal Medicine

## 2022-04-14 ENCOUNTER — Telehealth: Payer: Self-pay | Admitting: Family Medicine

## 2022-04-14 NOTE — Telephone Encounter (Signed)
Pt has a skin tag nasal side of left eye & wants to know if you could remove it.  I asked if he could send a picture so you could see location & he said he can't send picture thru mychart he already tried.  Michela Pitcher it is not right on the lid near the lashes.  Is this something you could do or prefer to send to dermatology? He said you could send reply thru [employer]

## 2022-04-16 NOTE — Telephone Encounter (Signed)
Called pt & informed, he will call & schedule

## 2022-05-18 ENCOUNTER — Other Ambulatory Visit: Payer: Self-pay | Admitting: Family Medicine

## 2022-05-18 DIAGNOSIS — I10 Essential (primary) hypertension: Secondary | ICD-10-CM

## 2022-06-03 ENCOUNTER — Telehealth: Payer: Self-pay | Admitting: Family Medicine

## 2022-06-03 NOTE — Telephone Encounter (Signed)
Pt called and is requesting a refill on his Testerone  Please send to the [facility] - [place], [place] - [address]   Pt has a cpe may the 1st

## 2022-06-06 ENCOUNTER — Encounter: Payer: Self-pay | Admitting: Family Medicine

## 2022-06-08 MED ORDER — NONFORMULARY OR COMPOUNDED ITEM: 1 refills | Status: DC

## 2022-06-09 NOTE — Telephone Encounter (Signed)
Called in med 

## 2022-08-05 ENCOUNTER — Ambulatory Visit (INDEPENDENT_AMBULATORY_CARE_PROVIDER_SITE_OTHER): Payer: Self-pay | Admitting: Family Medicine

## 2022-08-05 ENCOUNTER — Encounter: Payer: Self-pay | Admitting: Family Medicine

## 2022-08-05 VITALS — BP 112/82 | HR 67 | Temp 97.8°F | Resp 16 | Ht 71.5 in | Wt 223.2 lb

## 2022-08-05 DIAGNOSIS — K219 Gastro-esophageal reflux disease without esophagitis: Secondary | ICD-10-CM

## 2022-08-05 DIAGNOSIS — Z860100 Personal history of colon polyps, unspecified: Secondary | ICD-10-CM

## 2022-08-05 DIAGNOSIS — Z0000 Encounter for general adult medical examination without abnormal findings: Secondary | ICD-10-CM

## 2022-08-05 DIAGNOSIS — J3089 Other allergic rhinitis: Secondary | ICD-10-CM

## 2022-08-05 DIAGNOSIS — Z800 Family history of malignant neoplasm of digestive organs: Secondary | ICD-10-CM

## 2022-08-05 DIAGNOSIS — Z91018 Allergy to other foods: Secondary | ICD-10-CM

## 2022-08-05 DIAGNOSIS — Z13220 Encounter for screening for lipoid disorders: Secondary | ICD-10-CM

## 2022-08-05 DIAGNOSIS — Z125 Encounter for screening for malignant neoplasm of prostate: Secondary | ICD-10-CM

## 2022-08-05 DIAGNOSIS — I10 Essential (primary) hypertension: Secondary | ICD-10-CM

## 2022-08-05 DIAGNOSIS — N529 Male erectile dysfunction, unspecified: Secondary | ICD-10-CM

## 2022-08-05 DIAGNOSIS — E291 Testicular hypofunction: Secondary | ICD-10-CM

## 2022-08-05 MED ORDER — OMEPRAZOLE 20 MG PO CPDR: 20.0000 mg | DELAYED_RELEASE_CAPSULE | Freq: Every day | ORAL | 3 refills | Status: DC

## 2022-08-05 MED ORDER — AMLODIPINE BESYLATE 5 MG PO TABS: 5.0000 mg | ORAL_TABLET | Freq: Every day | ORAL | 1 refills | Status: DC

## 2022-08-05 MED ORDER — LISINOPRIL-HYDROCHLOROTHIAZIDE 20-12.5 MG PO TABS: 1.0000 | ORAL_TABLET | Freq: Every day | ORAL | 0 refills | Status: DC

## 2022-08-05 MED ORDER — TADALAFIL 20 MG PO TABS: ORAL_TABLET | ORAL | 3 refills | Status: DC

## 2022-08-05 NOTE — Progress Notes (Signed)
Complete physical exam  Patient: Craig Byrd   DOB: 1966/01/16   57 y.o. Male  MRN: [id number]  Subjective:    Chief Complaint  Patient presents with   Annual Exam    Fasting. No additional concerns.     Craig Byrd is a 57 y.o. male who presents today for a complete physical exam. He reports consuming a general diet. The patient does not participate in regular exercise at present. He generally feels fairly well. He reports sleeping fairly well. He has lost 15 pounds by making changes in his diet and is happy with this.  Review of the record indicates that he apparently had skin testing as well as blood work indicating alpha-gal but states that he has been able to eat red meats with no difficulties at all.  He continues to alternately use sildenafil and sildenafil to help with his underlying ADD.  Continues on testosterone and having no difficulty with that.  He does have a history of colonic polyps and should be getting another colonoscopy at the end of the year.  His allergies seem to be under good control.He would like a refill on his Prilosec as he does use this on an as-needed basis.  Most recent fall risk assessment:    08/05/2022    8:43 AM  Fall Risk   Falls in the past year? 0  Number falls in past yr: 0  Injury with Fall? 0  Risk for fall due to : No Fall Risks  Follow up Falls evaluation completed     Most recent depression screenings:    08/05/2022    8:43 AM 07/14/2021   11:27 AM  PHQ 2/9 Scores  PHQ - 2 Score 0 0    Vision:Within last year and Dental: Receives regular dental care    Patient Care Team: Ronnald Nian, MD as PCP - General (Family Medicine)   Outpatient Medications Prior to Visit  Medication Sig   levocetirizine (XYZAL) 5 MG tablet Take 1 tablet (5 mg total) by mouth every evening.   NONFORMULARY OR COMPOUNDED ITEM Testosterone 12% 120mg /ml gel one a day   EPINEPHrine 0.3 mg/0.3 mL IJ SOAJ injection  (Patient not taking: Reported on 05/07/2021)    [DISCONTINUED] amLODipine (NORVASC) 5 MG tablet TAKE ONE TABLET BY MOUTH ONCE DAILY   [DISCONTINUED] amoxicillin-clavulanate (AUGMENTIN) 875-125 MG tablet Take 1 tablet by mouth 2 (two) times daily. (Patient not taking: Reported on 05/16/2020)   [DISCONTINUED] lisinopril-hydrochlorothiazide (ZESTORETIC) 20-12.5 MG tablet TAKE ONE TABLET BY MOUTH ONCE DAILY   [DISCONTINUED] omeprazole (PRILOSEC) 20 MG capsule Take 1 capsule (20 mg total) by mouth daily.   [DISCONTINUED] predniSONE (STERAPRED UNI-PAK 48 TAB) 10 MG (48) TBPK tablet Take as per manufacturer's recommendations (Patient not taking: Reported on 03/21/2020)   [DISCONTINUED] tadalafil (CIALIS) 20 MG tablet TAKE 1 TABLET AS DIRECTED AS NEEDED   No facility-administered medications prior to visit.    Review of Systems  All other systems reviewed and are negative.         Objective:     BP 112/82   Pulse 67   Temp 97.8 F (36.6 C) (Oral)   Resp 16   Ht 5' 11.5" (1.816 m)   Wt 223 lb 3.2 oz (101.2 kg)   SpO2 97% Comment: room air  BMI 30.70 kg/m    Physical Exam   Alert and in no distress. Tympanic membranes and canals are normal. Pharyngeal area is normal. Neck is supple without adenopathy or thyromegaly. Cardiac  exam shows a regular sinus rhythm without murmurs or gallops. Lungs are clear to auscultation.     Assessment & Plan:    Routine general medical examination at a health care facility  Perennial allergic rhinitis - Plan: omeprazole (PRILOSEC) 20 MG capsule  Gastroesophageal reflux disease, unspecified whether esophagitis present  Hypogonadism in male - Plan: Testosterone  History of colonic polyps  Family history of colon cancer  Erectile dysfunction, unspecified erectile dysfunction type - Plan: tadalafil (CIALIS) 20 MG tablet  Allergy to alpha-gal  Screening for lipid disorders - Plan: Lipid panel  Screening for prostate cancer - Plan: PSA  Essential hypertension - Plan: CBC with  Differential/Platelet, Comprehensive metabolic panel, lisinopril-hydrochlorothiazide (ZESTORETIC) 20-12.5 MG tablet, amLODipine (NORVASC) 5 MG tablet  Immunization History  Administered Date(s) Administered   Influenza,inj,Quad PF,6+ Mos 12/07/2012, 12/05/2013, 01/04/2018   Influenza-Unspecified 01/17/2018, 12/21/2018, 02/02/2020, 01/18/2021, 01/15/2022   PFIZER(Purple Top)SARS-COV-2 Vaccination 06/18/2019, 07/19/2019   Tdap 10/08/2017   Typhoid Live 10/08/2017   Zoster Recombinat (Shingrix) 07/12/2018, 01/18/2019    Health Maintenance  Topic Date Due   HIV Screening  Never done   COVID-19 Vaccine (3 - 2023-24 season) 12/05/2021   INFLUENZA VACCINE  11/05/2022   COLONOSCOPY (Pts 45-31yrs Insurance coverage will need to be confirmed)  03/26/2023   DTaP/Tdap/Td (2 - Td or Tdap) 10/09/2027   Hepatitis C Screening  Completed   Zoster Vaccines- Shingrix  Completed   HPV VACCINES  Aged Out     Problem List Items Addressed This Visit     Allergy to alpha-gal   Erectile dysfunction   Relevant Medications   tadalafil (CIALIS) 20 MG tablet   Esophageal reflux   Relevant Medications   omeprazole (PRILOSEC) 20 MG capsule   Family history of colon cancer   History of colonic polyps   Hypogonadism in male   Relevant Orders   Testosterone   Perennial allergic rhinitis   Relevant Medications   omeprazole (PRILOSEC) 20 MG capsule   Other Visit Diagnoses     Routine general medical examination at a health care facility    -  Primary   Screening for lipid disorders       Relevant Orders   Lipid panel   Screening for prostate cancer       Relevant Orders   PSA   Essential hypertension       Relevant Medications   tadalafil (CIALIS) 20 MG tablet   lisinopril-hydrochlorothiazide (ZESTORETIC) 20-12.5 MG tablet   amLODipine (NORVASC) 5 MG tablet   Other Relevant Orders   CBC with Differential/Platelet   Comprehensive metabolic panel      I congratulated him on his weight loss.   Also recheck him in about 6 months to follow-up on his testosterone.  Continue present medications.  I also agreed with him on the alpha gal issue that since he is able to eat red meats without difficulty, hard to believe he truly has that.  Sharlot Gowda, MD

## 2022-08-06 LAB — COMPREHENSIVE METABOLIC PANEL
ALT: 46 IU/L — ABNORMAL HIGH (ref 0–44)
AST: 26 IU/L (ref 0–40)
Albumin/Globulin Ratio: 2 (ref 1.2–2.2)
Albumin: 4.9 g/dL (ref 3.8–4.9)
Alkaline Phosphatase: 61 IU/L (ref 44–121)
BUN/Creatinine Ratio: 14 (ref 9–20)
BUN: 17 mg/dL (ref 6–24)
Bilirubin Total: 0.6 mg/dL (ref 0.0–1.2)
CO2: 25 mmol/L (ref 20–29)
Calcium: 10.3 mg/dL — ABNORMAL HIGH (ref 8.7–10.2)
Chloride: 97 mmol/L (ref 96–106)
Creatinine, Ser: 1.19 mg/dL (ref 0.76–1.27)
Globulin, Total: 2.4 g/dL (ref 1.5–4.5)
Glucose: 101 mg/dL — ABNORMAL HIGH (ref 70–99)
Potassium: 4.8 mmol/L (ref 3.5–5.2)
Sodium: 138 mmol/L (ref 134–144)
Total Protein: 7.3 g/dL (ref 6.0–8.5)
eGFR: 72 mL/min/{1.73_m2} (ref >59)

## 2022-08-06 LAB — CBC WITH DIFFERENTIAL/PLATELET
Basophils Absolute: 0 10*3/uL (ref 0.0–0.2)
Basos: 1 %
EOS (ABSOLUTE): 0.1 10*3/uL (ref 0.0–0.4)
Eos: 3 %
Hematocrit: 53.6 % — ABNORMAL HIGH (ref 37.5–51.0)
Hemoglobin: 17.4 g/dL (ref 13.0–17.7)
Immature Grans (Abs): 0 10*3/uL (ref 0.0–0.1)
Immature Granulocytes: 0 %
Lymphocytes Absolute: 1.6 10*3/uL (ref 0.7–3.1)
Lymphs: 32 %
MCH: 26.9 pg (ref 26.6–33.0)
MCHC: 32.5 g/dL (ref 31.5–35.7)
MCV: 83 fL (ref 79–97)
Monocytes Absolute: 0.4 10*3/uL (ref 0.1–0.9)
Monocytes: 8 %
Neutrophils Absolute: 2.7 10*3/uL (ref 1.4–7.0)
Neutrophils: 56 %
Platelets: 307 10*3/uL (ref 150–450)
RBC: 6.46 x10E6/uL — ABNORMAL HIGH (ref 4.14–5.80)
RDW: 13.5 % (ref 11.6–15.4)
WBC: 4.9 10*3/uL (ref 3.4–10.8)

## 2022-08-06 LAB — LIPID PANEL
Chol/HDL Ratio: 5.4 ratio — ABNORMAL HIGH (ref 0.0–5.0)
Cholesterol, Total: 190 mg/dL (ref 100–199)
HDL: 35 mg/dL — ABNORMAL LOW (ref >39)
LDL Chol Calc (NIH): 137 mg/dL — ABNORMAL HIGH (ref 0–99)
Triglycerides: 96 mg/dL (ref 0–149)
VLDL Cholesterol Cal: 18 mg/dL (ref 5–40)

## 2022-08-06 LAB — PSA: Prostate Specific Ag, Serum: 1.5 ng/mL (ref 0.0–4.0)

## 2022-08-06 LAB — TESTOSTERONE: Testosterone: 450 ng/dL (ref 264–916)

## 2022-08-17 ENCOUNTER — Telehealth: Payer: Self-pay | Admitting: Internal Medicine

## 2022-08-17 NOTE — Telephone Encounter (Signed)
Medication NDC Prescription # DAW  TESTOSTERONE (AT) (TOPI) 12% (120MG /ML) GEL 12% (120MG /ML) GEL Supply [id number] No  Written Date Last Fill Date Quantity Days Supply  06/08/2022 06/08/2022 75 each 60  Sig Notes  Apply 1.105ml (5 clicks) daily as directed. Not Available   [facility]- requesting refill

## 2022-08-18 NOTE — Telephone Encounter (Signed)
[facility] to [facility] with 1 refill

## 2022-10-05 ENCOUNTER — Telehealth: Payer: Self-pay | Admitting: Internal Medicine

## 2022-10-05 NOTE — Telephone Encounter (Signed)
Refill request

## 2022-10-05 NOTE — Telephone Encounter (Signed)
Order Details  Medication NDC Prescription # DAW  TESTOSTERONE (AT) (TOPI) 12% (120MG /ML) GEL 12% (120MG /ML) GEL Supply [id number] No  Written Date Last Fill Date Quantity Days Supply  08/18/2022 08/18/2022 75 each 60  Sig Notes  Apply 1.89ml (5 clicks) daily as directed. Not Available    [facility] [phone] [address] [place] [address] [phone] [facility]

## 2022-10-06 NOTE — Telephone Encounter (Signed)
Called in med to [facility]

## 2022-11-19 ENCOUNTER — Other Ambulatory Visit: Payer: Self-pay | Admitting: Family Medicine

## 2022-11-19 DIAGNOSIS — I10 Essential (primary) hypertension: Secondary | ICD-10-CM

## 2023-02-12 ENCOUNTER — Telehealth: Payer: Self-pay | Admitting: Family Medicine

## 2023-02-12 MED ORDER — NONFORMULARY OR COMPOUNDED ITEM: 1 refills | Status: DC

## 2023-02-12 NOTE — Telephone Encounter (Signed)
Needs testosterone refill  [facility]

## 2023-02-12 NOTE — Telephone Encounter (Signed)
Called in medication.

## 2023-02-24 ENCOUNTER — Encounter: Payer: Self-pay | Admitting: Gastroenterology

## 2023-02-25 ENCOUNTER — Other Ambulatory Visit: Payer: Self-pay | Admitting: Family Medicine

## 2023-02-25 DIAGNOSIS — I10 Essential (primary) hypertension: Secondary | ICD-10-CM

## 2023-04-19 ENCOUNTER — Encounter: Payer: Self-pay | Admitting: Gastroenterology

## 2023-05-21 ENCOUNTER — Ambulatory Visit: Payer: Self-pay | Admitting: *Deleted

## 2023-05-21 ENCOUNTER — Telehealth: Payer: Self-pay | Admitting: *Deleted

## 2023-05-21 VITALS — Ht 71.05 in | Wt 225.0 lb

## 2023-05-21 DIAGNOSIS — Z860100 Personal history of colon polyps, unspecified: Secondary | ICD-10-CM

## 2023-05-21 DIAGNOSIS — Z800 Family history of malignant neoplasm of digestive organs: Secondary | ICD-10-CM

## 2023-05-21 MED ORDER — SUFLAVE 178.7 G PO SOLR: 1.0000 | Freq: Once | ORAL | 0 refills | Status: AC

## 2023-05-21 NOTE — Telephone Encounter (Signed)
Attempt to reach pt for pre-visit. LM with call back #.   Will attempt other number in profile   Second attempt to reach pt for pre-vist unsuccessful. LM with facility # for pt to call back. Instructed pt to call # given by end of the day and reschedule the pre-visit  with RN or the scheduled procedure will be canceled.

## 2023-05-21 NOTE — Progress Notes (Signed)
Pt's name and DOB verified at the beginning of the pre-visit wit 2 identifiers  Pt denies any difficulty with ambulating,sitting, laying down or rolling side to side  Pt has no issues with ambulation   Pt has no issues moving head neck or swallowing  No egg or soy allergy known to patient   No issues known to pt with past sedation with any surgeries or procedures  Pt denies having issues being intubated  No FH of Malignant Hyperthermia  Pt is not on diet pills or shots  Pt is not on home 02   Pt is not on blood thinners   Pt denies issues with constipation   Pt is not on dialysis  Pt denise any abnormal heart rhythms   Pt denies any upcoming cardiac testing  Patient's chart reviewed by Cathlyn Parsons CNRA prior to pre-visit and patient appropriate for the [facility].  Pre-visit completed and red dot placed by patient's name on their procedure day (on provider's schedule).    Chart not reviewed by CRNA prior to [facility]  Visit by phone  Pt states weight is 225 lb   IInstructions reviewed. Pt given [facility], [facility] main # and MD on call # prior to instructions.  Pt states understanding of instructions. Instructed to review again prior to procedure. Pt states they will.   Informed pt that they will receive a text or  call from [facility] regarding there prep med.

## 2023-06-01 ENCOUNTER — Encounter: Payer: Self-pay | Admitting: Internal Medicine

## 2023-06-03 ENCOUNTER — Encounter: Payer: Self-pay | Admitting: Family Medicine

## 2023-06-07 ENCOUNTER — Other Ambulatory Visit: Payer: Self-pay | Admitting: Family Medicine

## 2023-06-07 NOTE — Telephone Encounter (Signed)
 Copied from CRM [id number]. Topic: Clinical - Medication Refill >> Jun 07, 2023 10:48 AM Payton Doughty wrote: Most Recent Primary Care Visit:  Provider: Ronnald Nian  Department: [facility]  Visit Type: PHYSICAL 45  Date: 08/05/2022  Medication: testosterone (ANDROGEL) 20.25 MG/1.25GM (1.62%) GEL [1  Has the patient contacted their pharmacy? No Is this the correct pharmacy for this prescription? No If no, delete pharmacy and type the correct one.  This is the patient's preferred pharmacy:   [facility] - [place], [place] - [address] [place] [address] Phone: [phone] Fax: [phone]   Has the prescription been filled recently? No  Is the patient out of the medication? Yes  Has the patient been seen for an appointment in the last year OR does the patient have an upcoming appointment? Yes  Can we respond through MyChart? Yes  Pt  states he requested this medication via mychart and was told if he scheduled a cpe, the med would be sent in.  However, I do not see this med has been sent in if quite a while

## 2023-06-08 NOTE — Telephone Encounter (Signed)
 Last Fill: Unknown  Last OV: 08/05/22 Next OV: 09/08/23  Routing to provider for review/authorization.

## 2023-06-08 NOTE — Telephone Encounter (Signed)
 Last apt 08/05/22 has another rapt. Coming up in June

## 2023-06-10 MED ORDER — TESTOSTERONE 50 MG/5GM (1%) TD GEL: 5.0000 g | Freq: Every day | TRANSDERMAL | 1 refills | Status: DC

## 2023-06-14 ENCOUNTER — Telehealth: Payer: Self-pay | Admitting: Family Medicine

## 2023-06-14 ENCOUNTER — Other Ambulatory Visit: Payer: Self-pay | Admitting: Internal Medicine

## 2023-06-14 ENCOUNTER — Other Ambulatory Visit: Payer: Self-pay | Admitting: Family Medicine

## 2023-06-14 MED ORDER — NONFORMULARY OR COMPOUNDED ITEM: 0 refills | Status: DC

## 2023-06-14 MED ORDER — NONFORMULARY OR COMPOUNDED ITEM: 1 refills | Status: DC

## 2023-06-14 NOTE — Telephone Encounter (Signed)
 I have routed this to [facility] for him to approve it

## 2023-06-14 NOTE — Telephone Encounter (Addendum)
 [facility] 75G is a 2 month supply with 1 refill to last him until June for his appt  75G with 2 refills= 6 months and that's what patient wants going forward. I advised pt we can do this after his visit

## 2023-06-14 NOTE — Telephone Encounter (Signed)
 Patient calling back checking on the status of message below and would like the covering physician to send rx to the correct pharmacy today. Patient states he is not asking for a new script but for the script to be send to the correct pharmacy. Patient would like a follow up call today.

## 2023-06-14 NOTE — Telephone Encounter (Signed)
 Copied from CRM [id number]. Topic: Clinical - Medication Refill >> Jun 14, 2023  1:33 PM DeAngela L wrote: Most Recent Primary Care Visit:  Provider: Ronnald Nian  Department: [facility]  Visit Type: PHYSICAL 45  Date: 08/05/2022  Medication: NONFORMULARY OR COMPOUNDED ITEM [[id number]] Dose, Route, Frequency: As Directed Dispense Quantity: 75 each Refills: 1      Sig: Testosterone 12% 120mg /ml. Apply 1.78ml (5 clicks) daily   Has the patient contacted their pharmacy? yes (Agent: If no, request that the patient contact the pharmacy for the refill. If patient does not wish to contact the pharmacy document the reason why and proceed with request.) (Agent: If yes, when and what did the pharmacy advise?)  Is this the correct pharmacy for this prescription? yes If no, delete pharmacy and type the correct one.  This is the patient's preferred pharmacy:  [facility] - [place], [place] - [address] [place] [address] Phone: [phone] Fax: [phone]   Has the prescription been filled recently? yes  Is the patient out of the medication? yes  Has the patient been seen for an appointment in the last year OR does the patient have an upcoming appointment? yes  Can we respond through MyChart? no  Agent: Please be advised that Rx refills may take up to 3 business days. We ask that you follow-up with your pharmacy.

## 2023-06-14 NOTE — Telephone Encounter (Signed)
 error

## 2023-06-14 NOTE — Addendum Note (Signed)
 Addended by: Herminio Commons A on: 06/14/2023 05:23 PM   Modules accepted: Orders

## 2023-06-25 ENCOUNTER — Ambulatory Visit: Payer: Self-pay | Admitting: Gastroenterology

## 2023-06-25 ENCOUNTER — Encounter: Payer: Self-pay | Admitting: Gastroenterology

## 2023-06-25 VITALS — BP 107/74 | HR 83 | Temp 98.3°F | Resp 15 | Ht 71.5 in | Wt 225.0 lb

## 2023-06-25 DIAGNOSIS — Z860102 Personal history of hyperplastic colon polyps: Secondary | ICD-10-CM

## 2023-06-25 DIAGNOSIS — Z1211 Encounter for screening for malignant neoplasm of colon: Secondary | ICD-10-CM

## 2023-06-25 DIAGNOSIS — Z800 Family history of malignant neoplasm of digestive organs: Secondary | ICD-10-CM

## 2023-06-25 DIAGNOSIS — Z860100 Personal history of colon polyps, unspecified: Secondary | ICD-10-CM

## 2023-06-25 MED ORDER — SODIUM CHLORIDE 0.9 % IV SOLN: 500.0000 mL | Freq: Once | INTRAVENOUS | Status: DC

## 2023-06-25 NOTE — Progress Notes (Signed)
 History and Physical:  This patient presents for endoscopic testing for: Encounter Diagnoses  Name Primary?   Family history of malignant neoplasm of gastrointestinal tract Yes   History of colonic polyps     58 year old man here today for surveillance of colon polyps and family history of colon cancer. Last colonoscopy was in December 2019, at which time a 10 mm cecal polyp with ulceration was removed, pathology returned as hyperplastic. Patient denies chronic abdominal pain, rectal bleeding, constipation or diarrhea.   Patient is otherwise without complaints or active issues today.   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 90 tablet 1   lisinopril-hydrochlorothiazide (ZESTORETIC) 20-12.5 MG tablet TAKE 1 TABLET BY MOUTH EVERY MORNING 90 tablet 1   Multiple Vitamin (MULTIVITAMIN) capsule Take 1 capsule by mouth daily.     NONFORMULARY OR COMPOUNDED ITEM Testosterone 12% 120mg /ml. Apply 1.101ml (5 clicks) daily. (patient wants 6 months at a time called in- 75g with 2 refills= 6 months) 75 each 1   omeprazole (PRILOSEC) 20 MG capsule Take 1 capsule (20 mg total) by mouth daily. 90 capsule 3   tadalafil (CIALIS) 20 MG tablet TAKE 1 TABLET AS DIRECTED AS NEEDED 30 tablet 3   EPINEPHrine 0.3 mg/0.3 mL IJ SOAJ injection  (Patient not taking: Reported on 06/25/2023)  3    levocetirizine (XYZAL) 5 MG tablet Take 1 tablet (5 mg total) by mouth every evening. (Patient not taking: Reported on 06/25/2023) 30 tablet 5   Current Facility-Administered Medications  Medication Dose Route Frequency Provider Last Rate Last Admin   0.9 %  sodium chloride infusion  500 mL Intravenous Once Danis, Andreas Blower, MD          ___________________________________________________________________ Objective   Exam:  BP (!) 153/90   Pulse 80   Temp 98.3 F (36.8 C) (Temporal)   Resp (!) 33   Ht 5' 11.5" (1.816 m)   Wt 225 lb (102.1 kg)   SpO2 96%   BMI 30.94 kg/m   CV: regular , S1/S2 Resp: clear to auscultation bilaterally, normal RR and effort noted GI: soft, no tenderness, with active bowel sounds.   Assessment: Encounter Diagnoses  Name Primary?   Family history of malignant neoplasm of gastrointestinal tract Yes   History of colonic polyps      Plan: Colonoscopy  The benefits and risks of the planned procedure(s) were described in detail with the patient or (when appropriate) their health care proxy.  Risks were outlined as including, but not limited to, bleeding, infection, perforation, adverse medication reaction leading to cardiac or pulmonary decompensation, pancreatitis (if ERCP).  The limitation of incomplete mucosal visualization was also discussed.  No guarantees or warranties were given.  The patient is appropriate for an endoscopic procedure in the ambulatory setting.   - Amada Jupiter, MD

## 2023-06-25 NOTE — Patient Instructions (Signed)
 Thank you for letting us care for your healthcare needs today!  YOU HAD AN ENDOSCOPIC PROCEDURE TODAY AT THE [facility]:   Refer to the procedure report that was given to you for any specific questions about what was found during the examination.  If the procedure report does not answer your questions, please call your gastroenterologist to clarify.  If you requested that your care partner not be given the details of your procedure findings, then the procedure report has been included in a sealed envelope for you to review at your convenience later.  YOU SHOULD EXPECT: Some feelings of bloating in the abdomen. Passage of more gas than usual.  Walking can help get rid of the air that was put into your GI tract during the procedure and reduce the bloating. If you had a lower endoscopy (such as a colonoscopy or flexible sigmoidoscopy) you may notice spotting of blood in your stool or on the toilet paper. If you underwent a bowel prep for your procedure, you may not have a normal bowel movement for a few days.  Please Note:  You might notice some irritation and congestion in your nose or some drainage.  This is from the oxygen used during your procedure.  There is no need for concern and it should clear up in a day or so.  SYMPTOMS TO REPORT IMMEDIATELY:  Following lower endoscopy (colonoscopy or flexible sigmoidoscopy):  Excessive amounts of blood in the stool  Significant tenderness or worsening of abdominal pains  Swelling of the abdomen that is new, acute  Fever of 100F or higher  For urgent or emergent issues, a gastroenterologist can be reached at any hour by calling [phone]. Do not use MyChart messaging for urgent concerns.    DIET:  We do recommend a small meal at first, but then you may proceed to your regular diet.  Drink plenty of fluids but you should avoid alcoholic beverages for 24 hours.  ACTIVITY:  You should plan to take it easy for the rest of today and you  should NOT DRIVE or use heavy machinery until tomorrow (because of the sedation medicines used during the test).    FOLLOW UP: Our staff will call the number listed on your records the next business day following your procedure.  We will call around 7:15- 8:00 am to check on you and address any questions or concerns that you may have regarding the information given to you following your procedure. If we do not reach you, we will leave a message.     If any biopsies were taken you will be contacted by phone or by letter within the next 1-3 weeks.  Please call us at [phone] if you have not heard about the biopsies in 3 weeks.    SIGNATURES/CONFIDENTIALITY: You and/or your care partner have signed paperwork which will be entered into your electronic medical record.  These signatures attest to the fact that that the information above on your After Visit Summary has been reviewed and is understood.  Full responsibility of the confidentiality of this discharge information lies with you and/or your care-partner.

## 2023-06-25 NOTE — Progress Notes (Signed)
 A/O x 3, gd SR's, VSS, report to RN

## 2023-06-25 NOTE — Progress Notes (Signed)
 Pt's states no medical or surgical changes since previsit or office visit.

## 2023-06-25 NOTE — Op Note (Signed)
 [facility] Patient Name: Craig Byrd Procedure Date: 06/25/2023 10:10 AM MRN: [id number] Endoscopist: Sherilyn Cooter L. Myrtie Neither , MD, [id number] Age: 58 Referring MD:  Date of Birth: 1965/05/02 Gender: Male Account #: [id number] Procedure:                Colonoscopy Indications:              Colon cancer screening in patient at increased                            risk: Colorectal cancer in father                           Personal history of colonic polyps (10 mm ulcerated                            cecal hyperplastic polyp last colonoscopy December                            2019) Medicines:                Monitored Anesthesia Care Procedure:                Pre-Anesthesia Assessment:                           - Prior to the procedure, a History and Physical                            was performed, and patient medications and                            allergies were reviewed. The patient's tolerance of                            previous anesthesia was also reviewed. The risks                            and benefits of the procedure and the sedation                            options and risks were discussed with the patient.                            All questions were answered, and informed consent                            was obtained. Prior Anticoagulants: The patient has                            taken no anticoagulant or antiplatelet agents. ASA                            Grade Assessment: II - A patient with mild systemic  disease. After reviewing the risks and benefits,                            the patient was deemed in satisfactory condition to                            undergo the procedure.                           After obtaining informed consent, the colonoscope                            was passed under direct vision. Throughout the                            procedure, the patient's blood pressure, pulse, and                             oxygen saturations were monitored continuously. The                            Olympus Scope SN: [id number] was introduced through                            the anus and advanced to the the terminal ileum,                            with identification of the appendiceal orifice and                            IC valve. The colonoscopy was performed without                            difficulty. The patient tolerated the procedure                            well. The quality of the bowel preparation was                            excellent. The terminal ileum, ileocecal valve,                            appendiceal orifice, and rectum were photographed. Scope In: 10:33:25 AM Scope Out: 10:47:14 AM Scope Withdrawal Time: 0 hours 11 minutes 15 seconds  Total Procedure Duration: 0 hours 13 minutes 49 seconds  Findings:                 The perianal and digital rectal examinations were                            normal.                           The terminal ileum appeared normal.  Repeat examination of right colon under NBI                            performed.                           The entire examined colon appeared normal on direct                            and retroflexion views. Complications:            No immediate complications. Estimated Blood Loss:     Estimated blood loss: none. Impression:               - The examined portion of the ileum was normal.                           - The entire examined colon is normal on direct and                            retroflexion views.                           - No specimens collected. Recommendation:           - Patient has a contact number available for                            emergencies. The signs and symptoms of potential                            delayed complications were discussed with the                            patient. Return to normal activities tomorrow.                            Written  discharge instructions were provided to the                            patient.                           - Resume previous diet.                           - Continue present medications.                           - Repeat colonoscopy in 5 years for screening                            purposes (family history of colon cancer). Montrae Braithwaite L. Myrtie Neither, MD 06/25/2023 10:51:41 AM This report has been signed electronically.

## 2023-06-28 ENCOUNTER — Telehealth: Payer: Self-pay | Admitting: *Deleted

## 2023-06-28 NOTE — Telephone Encounter (Signed)
  Follow up Call-     06/25/2023    9:48 AM  Call back number  Post procedure Call Back phone  # [phone]  Permission to leave phone message Yes   [phone] called Left message to call back if any questions or needs

## 2023-07-12 ENCOUNTER — Other Ambulatory Visit (INDEPENDENT_AMBULATORY_CARE_PROVIDER_SITE_OTHER): Payer: Self-pay

## 2023-07-12 ENCOUNTER — Ambulatory Visit (INDEPENDENT_AMBULATORY_CARE_PROVIDER_SITE_OTHER): Payer: Self-pay | Admitting: Orthopedic Surgery

## 2023-07-12 DIAGNOSIS — M25512 Pain in left shoulder: Secondary | ICD-10-CM

## 2023-07-12 DIAGNOSIS — G8929 Other chronic pain: Secondary | ICD-10-CM

## 2023-07-13 ENCOUNTER — Other Ambulatory Visit: Payer: Self-pay

## 2023-07-13 ENCOUNTER — Encounter: Payer: Self-pay | Admitting: Orthopedic Surgery

## 2023-07-13 DIAGNOSIS — G8929 Other chronic pain: Secondary | ICD-10-CM

## 2023-07-13 NOTE — Progress Notes (Unsigned)
 Office Visit Note   Patient: Craig Byrd           Date of Birth: Nov 17, 1965           MRN: [id number] Visit Date: 07/12/2023 Requested by: Ronnald Nian, MD [address] [place],  [place] [address] PCP: Ronnald Nian, MD  Subjective: Chief Complaint  Patient presents with   Left Shoulder - Pain    HPI: Craig Byrd is a 58 y.o. male who presents to the office reporting Left shoulder pain.  Been ongoing for months.  Does not recall a specific injury.  He is right-hand dominant.  The pain does wake him from sleep at night.  Denies any numbness and tingling or neck pain.  He has been doing Rolfing 2-3 times without relief which is sustained.  The pain does recur after the Rolfing.  Describes fairly deep anterolateral pain in the shoulder.  Resisted abduction is painful.  Symptoms are getting worse.  Pain radiates to the elbow but there is no numbness.  Pain is becoming more constant.  Has tried some sporadic over-the-counter medication such as naproxen but it has not helped much.  Hard for him to sleep on that left-hand side.  Hurts for him to hold his cell phone.  Overhead motion is very worse.  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                ROS: All systems reviewed are negative as they relate to the chief complaint within the history of present illness.  Patient denies fevers or chills.  Assessment & Plan: Visit Diagnoses:  1. Chronic left shoulder pain     Plan: Impression is atypical left shoulder pain.  Does not really look radicular in nature with no scapular pain and no neck pain.  I think is possible this could be labral pathology.  Rotator cuff feels good.  No strength deficit compared to the right-hand side.  He has tried physical therapy types of exercises as well as activity modification without relief.  Because of 3 months of symptoms and failure of conservative management MRI arthrogram of  the left shoulder is indicated to evaluate the labrum.  He does have a little bit of decreased neck extension on exam which we could investigate next with workup of the cervical spine if his shoulder workup is negative.  Also need radiographs of the tibial tubercle ossicle on the left knee on return.  AP and lateral radiographs should be sufficient.  Follow-Up Instructions: No follow-ups on file.   Orders:  Orders Placed This Encounter  Procedures   XR Shoulder Left   No orders of the defined types were placed in this encounter.     Procedures: No procedures performed   Clinical Data: No additional findings.  Objective: Vital Signs: There were no vitals taken for this visit.  Physical Exam:  Constitutional: Patient appears well-developed HEENT:  Head: Normocephalic Eyes:EOM are normal Neck: Normal range of motion Cardiovascular: Normal rate Pulmonary/chest: Effort normal Neurologic: Patient is alert Skin: Skin is warm Psychiatric: Patient has normal mood and affect  Ortho Exam: Ortho exam demonstrates bilateral passive shoulder range of motion of 45/95/170.  Rotator cuff strength is intact infraspinatus supraspinatus and subscap muscle testing.  No coarse grinding or crepitus with internal/external rotation of either shoulder.  No discrete AC joint tenderness to direct palpation on the left or right-hand side.  O'Brien's testing is positive on the left negative on the right speeds testing is positive on the left negative on the right. Patient has bilateral 5 out of 5 grip EPL FPL interosseous wrist flexion wrist extension bicep triceps and deltoid strength.  Bilateral palpable radial pulses and no paresthesias C5-T1 in either arm.  Neck range of motion flexion chin to chest with extension approximately 15 degrees with approximately 50 degrees of rotation bilaterally.  No masses lymphadenopathy or skin changes around the neck or shoulder girdle region bilaterally  Left knee exam  demonstrates prominence of a bony tubercle on the tibial tubercle itself.  No effusion.  No discrete bursal tissue over this prominence which measures about 7 x 7 mm.  Specialty Comments:  No specialty comments available.  Imaging: XR Shoulder Left Result Date: 07/13/2023 AP outlet axillary lateral radiographs left shoulder reviewed.  No acute fracture.  Shoulder is located.  Acromiohumeral distance is normal.  No significant degenerative changes in the glenohumeral or AC joint.  Visualized lung fields clear.     PMFS History: Patient Active Problem List   Diagnosis Date Noted   History of colonic polyps 08/08/2020   History of COVID-19 08/08/2020   Hypertension 08/23/2017   Allergy to alpha-gal 07/23/2017   Perennial allergic rhinitis 08/22/2015   Family history of colon cancer 08/22/2015   Erectile dysfunction 08/22/2015   Hypogonadism in male 08/22/2015   Esophageal reflux 08/22/2015   Chronic cough 10/09/2010   Past Medical History:  Diagnosis Date   ADD (attention deficit disorder)    Allergy    Angioedema 07/19/2017   Erectile dysfunction    GERD (gastroesophageal reflux disease)    Hypertension    Hypogonadism male     Family History  Problem Relation Age of Onset   Hypertension Mother    Crohn's disease Father    Colon cancer Father 69   Diabetes Father    Rectal cancer Father    Hypertension Sister    Heart disease Paternal Uncle    Pancreatic cancer Paternal Grandmother    Pancreatic cancer Paternal Grandfather    COPD Neg Hx    Colon polyps Neg Hx    Esophageal cancer Neg Hx    Stomach cancer Neg Hx     Past Surgical History:  Procedure Laterality Date   APPENDECTOMY  12/10/2003   COLONOSCOPY     VASECTOMY     Social History   Occupational History   Occupation: TEFL teacher: [employer]   Tobacco Use   Smoking status: Never   Smokeless tobacco: Never  Vaping Use   Vaping status: Never Used  Substance and Sexual  Activity   Alcohol use: Yes    Comment: 1-2 beers once a month   Drug use: No   Sexual activity: Not on file

## 2023-07-19 ENCOUNTER — Other Ambulatory Visit: Payer: Self-pay | Admitting: Medical Genetics

## 2023-07-26 ENCOUNTER — Other Ambulatory Visit: Payer: Self-pay

## 2023-07-26 ENCOUNTER — Ambulatory Visit
Admission: RE | Admit: 2023-07-26 | Discharge: 2023-07-26 | Disposition: A | Discharge status: Home / Self Care | Payer: Self-pay | Source: Ambulatory Visit | Attending: Orthopedic Surgery | Admitting: Orthopedic Surgery

## 2023-07-26 DIAGNOSIS — G8929 Other chronic pain: Secondary | ICD-10-CM

## 2023-07-26 DIAGNOSIS — Z006 Encounter for examination for normal comparison and control in clinical research program: Secondary | ICD-10-CM

## 2023-07-26 MED ORDER — IOPAMIDOL (ISOVUE-M 200) INJECTION 41%
15.0000 mL | Freq: Once | INTRAMUSCULAR | Status: AC
  Administered 2023-07-26: 15 mL via INTRA_ARTICULAR

## 2023-07-26 MED ORDER — GADOBENATE DIMEGLUMINE 529 MG/ML IV SOLN
0.1000 mL | Freq: Once | INTRAVENOUS | Status: AC | PRN
  Administered 2023-07-26: 0.1 mL via INTRA_ARTICULAR

## 2023-07-26 MED ORDER — LIDOCAINE 1 % OPTIME INJ - NO CHARGE
5.0000 mL | Freq: Once | INTRAMUSCULAR | Status: AC
  Administered 2023-07-26: 5 mL via INTRADERMAL

## 2023-08-02 ENCOUNTER — Ambulatory Visit (INDEPENDENT_AMBULATORY_CARE_PROVIDER_SITE_OTHER): Payer: Self-pay | Admitting: Orthopedic Surgery

## 2023-08-02 ENCOUNTER — Other Ambulatory Visit (INDEPENDENT_AMBULATORY_CARE_PROVIDER_SITE_OTHER): Payer: Self-pay

## 2023-08-02 DIAGNOSIS — M7552 Bursitis of left shoulder: Secondary | ICD-10-CM

## 2023-08-02 DIAGNOSIS — M25562 Pain in left knee: Secondary | ICD-10-CM

## 2023-08-02 LAB — GENECONNECT MOLECULAR SCREEN: Genetic Analysis Overall Interpretation: NEGATIVE

## 2023-08-03 ENCOUNTER — Encounter: Payer: Self-pay | Admitting: Orthopedic Surgery

## 2023-08-03 DIAGNOSIS — M7552 Bursitis of left shoulder: Secondary | ICD-10-CM

## 2023-08-03 MED ORDER — LIDOCAINE HCL 1 % IJ SOLN
5.0000 mL | INTRAMUSCULAR | Status: AC | PRN
  Administered 2023-08-03: 5 mL

## 2023-08-03 MED ORDER — BUPIVACAINE HCL 0.5 % IJ SOLN
9.0000 mL | INTRAMUSCULAR | Status: AC | PRN
  Administered 2023-08-03: 9 mL via INTRA_ARTICULAR

## 2023-08-03 MED ORDER — METHYLPREDNISOLONE ACETATE 40 MG/ML IJ SUSP
40.0000 mg | INTRAMUSCULAR | Status: AC | PRN
  Administered 2023-08-03: 40 mg via INTRA_ARTICULAR

## 2023-08-03 NOTE — Progress Notes (Signed)
 Office Visit Note   Patient: Craig Byrd           Date of Birth: 30-Aug-1965           MRN: [id number] Visit Date: 08/02/2023 Requested by: Watson Hacking, MD [address] [place],  [place] [address] PCP: Watson Hacking, MD  Subjective: Chief Complaint  Patient presents with   Left Shoulder - Follow-up    Review MRI   Left Knee - Pain    HPI: Craig Byrd is a 58 y.o. male who presents to the office reporting left shoulder pain.  He also has some left knee issues.  Since he was last seen he had a left shoulder MRI scan.  That shows moderate AC joint arthrosis with hypertrophy and mild reactive edema.  Rotator cuff is in tact and the labrum is intact.  Patient localizes the pain distinctly to the deltoid region and not the [facility] joint.  Takes naproxen daily.  He does state he is weak when he is lifting overhead..                ROS: All systems reviewed are negative as they relate to the chief complaint within the history of present illness.  Patient denies fevers or chills.  Assessment & Plan: Visit Diagnoses:  1. Left knee pain, unspecified chronicity     Plan: Impression is deltoid pain with possible subacromial bursitis.  Subacromial injection performed today.  Radiographs of the knee unremarkable in terms of that small spur in the tibial tubercle.  Nothing really to do about that in terms of intervention.  We will see how he does with the subacromial injection.  Follow-Up Instructions: No follow-ups on file.   Orders:  Orders Placed This Encounter  Procedures   XR Knee 1-2 Views Left   No orders of the defined types were placed in this encounter.     Procedures: Large Joint Inj: L subacromial bursa on 08/03/2023 11:41 AM Indications: diagnostic evaluation and pain Details: 18 G 1.5 in needle, posterior approach  Arthrogram: No  Medications: 9 mL bupivacaine 0.5 %; 40 mg methylPREDNISolone acetate 40 MG/ML; 5 mL lidocaine  1 % Outcome: tolerated well, no  immediate complications Procedure, treatment alternatives, risks and benefits explained, specific risks discussed. Consent was given by the patient. Immediately prior to procedure a time out was called to verify the correct patient, procedure, equipment, support staff and site/side marked as required. Patient was prepped and draped in the usual sterile fashion.       Clinical Data: No additional findings.  Objective: Vital Signs: There were no vitals taken for this visit.  Physical Exam:  Constitutional: Patient appears well-developed HEENT:  Head: Normocephalic Eyes:EOM are normal Neck: Normal range of motion Cardiovascular: Normal rate Pulmonary/chest: Effort normal Neurologic: Patient is alert Skin: Skin is warm Psychiatric: Patient has normal mood and affect  Ortho Exam: Ortho exam demonstrates full active and passive range of motion of both knees.  Slight tenderness around the tibial tubercle on the left.  Left shoulder demonstrates full active and passive range of motion with positive impingement signs but good rotator cuff strength and no discrete AC joint tenderness left versus right.  No coarse grinding or crepitus noted in that left shoulder girdle region.  Specialty Comments:  No specialty comments available.  Imaging: XR Knee 1-2 Views Left Result Date: 08/03/2023 AP lateral radiographs left knee reviewed.  Small spur noted at the patellar tendon attachment to the tibial tubercle..  No loose  fragments visible.  No arthritis in the remaining compartments of the knee including the medial lateral and patellofemoral compartments.    PMFS History: Patient Active Problem List   Diagnosis Date Noted   History of colonic polyps 08/08/2020   History of COVID-19 08/08/2020   Hypertension 08/23/2017   Allergy  to alpha-gal 07/23/2017   Perennial allergic rhinitis 08/22/2015   Family history of colon cancer 08/22/2015   Erectile dysfunction 08/22/2015   Hypogonadism in  male 08/22/2015   Esophageal reflux 08/22/2015   Chronic cough 10/09/2010   Past Medical History:  Diagnosis Date   ADD (attention deficit disorder)    Allergy     Angioedema 07/19/2017   Erectile dysfunction    GERD (gastroesophageal reflux disease)    Hypertension    Hypogonadism male     Family History  Problem Relation Age of Onset   Hypertension Mother    Crohn's disease Father    Colon cancer Father 10   Diabetes Father    Rectal cancer Father    Hypertension Sister    Heart disease Paternal Uncle    Pancreatic cancer Paternal Grandmother    Pancreatic cancer Paternal Grandfather    COPD Neg Hx    Colon polyps Neg Hx    Esophageal cancer Neg Hx    Stomach cancer Neg Hx     Past Surgical History:  Procedure Laterality Date   APPENDECTOMY  12/10/2003   COLONOSCOPY     VASECTOMY     Social History   Occupational History   Occupation: TEFL teacher: [employer]   Tobacco Use   Smoking status: Never   Smokeless tobacco: Never  Vaping Use   Vaping status: Never Used  Substance and Sexual Activity   Alcohol use: Yes    Comment: 1-2 beers once a month   Drug use: No   Sexual activity: Not on file

## 2023-08-09 ENCOUNTER — Other Ambulatory Visit: Payer: Self-pay

## 2023-09-06 ENCOUNTER — Encounter: Payer: Self-pay | Admitting: Family Medicine

## 2023-09-06 DIAGNOSIS — N529 Male erectile dysfunction, unspecified: Secondary | ICD-10-CM

## 2023-09-06 MED ORDER — TADALAFIL 20 MG PO TABS: ORAL_TABLET | ORAL | 3 refills | Status: AC

## 2023-09-07 ENCOUNTER — Other Ambulatory Visit: Payer: Self-pay | Admitting: *Deleted

## 2023-09-07 MED ORDER — NONFORMULARY OR COMPOUNDED ITEM: 0 refills | Status: DC

## 2023-09-08 ENCOUNTER — Encounter: Payer: Self-pay | Admitting: Family Medicine

## 2023-10-17 ENCOUNTER — Encounter: Payer: Self-pay | Admitting: Orthopedic Surgery

## 2023-10-19 NOTE — Telephone Encounter (Signed)
 Hi Betsy I sent him a note.  If he wants to come in and can you see if you can find him an appointment with me or Herlene sometime in the next week or 2 thanks.  If you talk to him you can offer him either me later or Herlene sooner thanks

## 2023-10-25 ENCOUNTER — Other Ambulatory Visit: Payer: Self-pay

## 2023-10-25 ENCOUNTER — Ambulatory Visit (INDEPENDENT_AMBULATORY_CARE_PROVIDER_SITE_OTHER): Payer: Self-pay | Admitting: Surgical

## 2023-10-25 DIAGNOSIS — M7552 Bursitis of left shoulder: Secondary | ICD-10-CM

## 2023-10-25 DIAGNOSIS — M25512 Pain in left shoulder: Secondary | ICD-10-CM

## 2023-10-25 DIAGNOSIS — G8929 Other chronic pain: Secondary | ICD-10-CM

## 2023-10-26 ENCOUNTER — Encounter: Payer: Self-pay | Admitting: Surgical

## 2023-10-26 MED ORDER — TRIAMCINOLONE ACETONIDE 40 MG/ML IJ SUSP
40.0000 mg | INTRAMUSCULAR | Status: AC | PRN
  Administered 2023-10-25: 40 mg via INTRA_ARTICULAR

## 2023-10-26 MED ORDER — BUPIVACAINE HCL 0.5 % IJ SOLN
9.0000 mL | INTRAMUSCULAR | Status: AC | PRN
  Administered 2023-10-25: 9 mL via INTRA_ARTICULAR

## 2023-10-26 MED ORDER — LIDOCAINE HCL 1 % IJ SOLN
5.0000 mL | INTRAMUSCULAR | Status: AC | PRN
  Administered 2023-10-25: 5 mL

## 2023-10-26 NOTE — Progress Notes (Signed)
   Procedure Note  Patient: Craig Byrd             Date of Birth: 12/01/1965           MRN: [id number]             Visit Date: 10/25/2023  Procedures: Visit Diagnoses:  1. Bursitis of left shoulder   2. Chronic left shoulder pain     Large Joint Inj: L subacromial bursa on 10/25/2023 9:23 PM Indications: diagnostic evaluation and pain Details: 18 G 1.5 in needle, posterior approach  Arthrogram: No  Medications: 9 mL bupivacaine  0.5 %; 5 mL lidocaine  1 %; 40 mg triamcinolone  acetonide 40 MG/ML Outcome: tolerated well, no immediate complications  Patient had 100% relief from prior SA inj Procedure, treatment alternatives, risks and benefits explained, specific risks discussed. Consent was given by the patient. Immediately prior to procedure a time out was called to verify the correct patient, procedure, equipment, support staff and site/side marked as required. Patient was prepped and draped in the usual sterile fashion.

## 2023-10-27 ENCOUNTER — Ambulatory Visit: Payer: Self-pay | Admitting: Orthopedic Surgery

## 2023-11-03 ENCOUNTER — Ambulatory Visit (INDEPENDENT_AMBULATORY_CARE_PROVIDER_SITE_OTHER): Payer: Self-pay | Admitting: Family Medicine

## 2023-11-03 ENCOUNTER — Encounter: Payer: Self-pay | Admitting: Family Medicine

## 2023-11-03 VITALS — BP 126/84 | HR 69 | Ht 72.0 in | Wt 227.0 lb

## 2023-11-03 DIAGNOSIS — E291 Testicular hypofunction: Secondary | ICD-10-CM

## 2023-11-03 DIAGNOSIS — I10 Essential (primary) hypertension: Secondary | ICD-10-CM

## 2023-11-03 DIAGNOSIS — Z0000 Encounter for general adult medical examination without abnormal findings: Secondary | ICD-10-CM

## 2023-11-03 DIAGNOSIS — Z79890 Hormone replacement therapy (postmenopausal): Secondary | ICD-10-CM

## 2023-11-03 DIAGNOSIS — E7800 Pure hypercholesterolemia, unspecified: Secondary | ICD-10-CM

## 2023-11-03 NOTE — Progress Notes (Signed)
   Name: Craig Byrd   Date of Visit: 11/03/23   Date of last visit with me: Visit date not found   CHIEF COMPLAINT:  Chief Complaint  Patient presents with   Annual Exam    Cpe. No other concerns.        HPI:  Discussed the use of AI scribe software for clinical note transcription with the patient, who gave verbal consent to proceed.  History of Present Illness   Craig Byrd is a 58 year old male who presents for a physical exam and lab work related to testosterone  therapy.  He is undergoing testosterone  therapy and has been having random lab tests rather than morning tests. No significant changes in energy or activity levels have been noted since starting the therapy. He has not had an echocardiogram since beginning testosterone  therapy.  He has a history of hypertension and hypercholesterolemia.  He experiences shoulder pain due to bursitis, for which he has received injections. The first injection was more effective than the second.  He has been on omeprazole  since the 1990s for heartburn, taking it once every morning. He notices heartburn if he misses a dose, especially if his diet includes more carbohydrates.  He has a personal history of cancer and recently had a colonoscopy, which was benign with a recommendation to repeat in five years.  He works around [employer], which may increase his risk for certain infections.         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 Discussed surgical intervention options including bursa removal and possible clavicle or acromion modification. Injections can be repeated every three months but may accelerate arthritic changes. Procedure primarily for pain relief and may not prevent future surgery. - Consider surgical intervention for pain relief. - Repeat injections every three months if needed.  Hypertension Hypertension monitored with basic labs. No acute concerns. - Order basic labs for hypertension monitoring.  Hypercholesterolemia Cholesterol levels have improved over past visits. - Order cholesterol labs for monitoring.  Gastroesophageal reflux disease (GERD) on long-term omeprazole  therapy Discussed risks of long-term omeprazole  use, including mental effects and increased pneumonia risk. Suggested trial of Pepcid to reduce omeprazole  use. - Trial Pepcid 20 mg in the morning and 20 mg before dinner.  Testosterone  therapy monitoring Discussed importance of monitoring heart health due to potential effects of testosterone  on heart muscle. Recommended echocardiogram to assess heart muscle changes. - Order echocardiogram to assess heart muscle changes due to testosterone  therapy.         Channa Hazelett A. Vita MD [facility]

## 2023-11-03 NOTE — Patient Instructions (Signed)
 VISIT SUMMARY:  You came in today for a physical exam and lab work related to your testosterone  therapy. We discussed your ongoing treatments and any changes in your health since your last visit.  YOUR PLAN:  -CHRONIC RIGHT SHOULDER BURSITIS WITH DEGENERATIVE CHANGES: Bursitis is inflammation of the fluid-filled sacs that cushion the joints. We discussed surgical options for pain relief and the possibility of repeating injections every three months if needed.  -HYPERTENSION: Hypertension is high blood pressure. We will continue to monitor your blood pressure with basic lab tests.  -HYPERCHOLESTEROLEMIA: Hypercholesterolemia is high cholesterol. Your cholesterol levels have improved, and we will continue to monitor them with lab tests.  -GASTROESOPHAGEAL REFLUX DISEASE (GERD) ON LONG-TERM OMEPRAZOLE  THERAPY: GERD is a condition where stomach acid frequently flows back into the tube connecting your mouth and stomach. We discussed the risks of long-term omeprazole  use and suggested trying Pepcid 20 mg in the morning and 20 mg before dinner to reduce your omeprazole  use.  -TESTOSTERONE  THERAPY MONITORING: Testosterone  therapy can affect your heart muscle. We recommended an echocardiogram to check for any changes in your heart muscle due to the therapy.  INSTRUCTIONS:  Please follow up with the recommended lab tests for hypertension and cholesterol monitoring. Schedule an echocardiogram to assess your heart muscle changes due to testosterone  therapy. If you experience increased shoulder pain, consider the surgical options we discussed or repeat injections every three months if needed. Try taking Pepcid 20 mg in the morning and 20 mg before dinner to reduce your omeprazole  use.

## 2023-11-04 ENCOUNTER — Ambulatory Visit: Payer: Self-pay | Admitting: Family Medicine

## 2023-11-04 LAB — CBC WITH DIFFERENTIAL/PLATELET
Basophils Absolute: 0 x10E3/uL (ref 0.0–0.2)
Basos: 0 %
EOS (ABSOLUTE): 0.1 x10E3/uL (ref 0.0–0.4)
Eos: 1 %
Hematocrit: 55 % — ABNORMAL HIGH (ref 37.5–51.0)
Hemoglobin: 18 g/dL — ABNORMAL HIGH (ref 13.0–17.7)
Immature Grans (Abs): 0.1 x10E3/uL (ref 0.0–0.1)
Immature Granulocytes: 1 %
Lymphocytes Absolute: 1.5 x10E3/uL (ref 0.7–3.1)
Lymphs: 18 %
MCH: 28.3 pg (ref 26.6–33.0)
MCHC: 32.7 g/dL (ref 31.5–35.7)
MCV: 86 fL (ref 79–97)
Monocytes Absolute: 0.6 x10E3/uL (ref 0.1–0.9)
Monocytes: 7 %
Neutrophils Absolute: 6.2 x10E3/uL (ref 1.4–7.0)
Neutrophils: 73 %
Platelets: 308 x10E3/uL (ref 150–450)
RBC: 6.37 x10E6/uL — ABNORMAL HIGH (ref 4.14–5.80)
RDW: 13.5 % (ref 11.6–15.4)
WBC: 8.4 x10E3/uL (ref 3.4–10.8)

## 2023-11-04 LAB — LIPID PANEL
Chol/HDL Ratio: 4.7 ratio (ref 0.0–5.0)
Cholesterol, Total: 193 mg/dL (ref 100–199)
HDL: 41 mg/dL (ref >39)
LDL Chol Calc (NIH): 129 mg/dL — ABNORMAL HIGH (ref 0–99)
Triglycerides: 125 mg/dL (ref 0–149)
VLDL Cholesterol Cal: 23 mg/dL (ref 5–40)

## 2023-11-04 LAB — PSA: Prostate Specific Ag, Serum: 1.5 ng/mL (ref 0.0–4.0)

## 2023-11-04 LAB — BASIC METABOLIC PANEL WITH GFR
BUN/Creatinine Ratio: 17 (ref 9–20)
BUN: 21 mg/dL (ref 6–24)
CO2: 22 mmol/L (ref 20–29)
Calcium: 10.2 mg/dL (ref 8.7–10.2)
Chloride: 96 mmol/L (ref 96–106)
Creatinine, Ser: 1.21 mg/dL (ref 0.76–1.27)
Glucose: 106 mg/dL — ABNORMAL HIGH (ref 70–99)
Potassium: 4.5 mmol/L (ref 3.5–5.2)
Sodium: 135 mmol/L (ref 134–144)
eGFR: 70 mL/min/1.73 (ref >59)

## 2023-11-04 LAB — TESTOSTERONE,FREE AND TOTAL
Testosterone, Free: 11.9 pg/mL (ref 7.2–24.0)
Testosterone: 760 ng/dL (ref 264–916)

## 2023-11-22 ENCOUNTER — Ambulatory Visit: Payer: Self-pay | Admitting: Family Medicine

## 2023-11-24 ENCOUNTER — Ambulatory Visit (INDEPENDENT_AMBULATORY_CARE_PROVIDER_SITE_OTHER): Payer: Self-pay | Admitting: Family Medicine

## 2023-11-24 ENCOUNTER — Encounter: Payer: Self-pay | Admitting: Family Medicine

## 2023-11-24 VITALS — BP 122/70 | HR 70 | Wt 224.8 lb

## 2023-11-24 DIAGNOSIS — R718 Other abnormality of red blood cells: Secondary | ICD-10-CM

## 2023-11-24 LAB — CBC
Hematocrit: 51.6 % — ABNORMAL HIGH (ref 37.5–51.0)
Hemoglobin: 16.6 g/dL (ref 13.0–17.7)
MCH: 27.5 pg (ref 26.6–33.0)
MCHC: 32.2 g/dL (ref 31.5–35.7)
MCV: 85 fL (ref 79–97)
Platelets: 287 x10E3/uL (ref 150–450)
RBC: 6.04 x10E6/uL — ABNORMAL HIGH (ref 4.14–5.80)
RDW: 12.6 % (ref 11.6–15.4)
WBC: 5.8 x10E3/uL (ref 3.4–10.8)

## 2023-11-24 NOTE — Progress Notes (Signed)
   Name: Craig Byrd   Date of Visit: 11/24/23   Date of last visit with me: 11/03/2023   CHIEF COMPLAINT:  Chief Complaint  Patient presents with   Follow-up    Recheck testosterone .        HPI:  Discussed the use of AI scribe software for clinical note transcription with the patient, who gave verbal consent to proceed.  History of Present Illness   Craig Byrd is a 58 year old male who presents for follow-up on testosterone  therapy management.  He has been on daily testosterone  therapy, which was abruptly stopped due to a hematocrit level of 55%, exceeding the safe threshold of 54%. A year ago, his hematocrit was 53.6%.  Since stopping testosterone , he has experienced mild side effects, including 'foggy brain' and lower libido. No significant fatigue or tiredness. No agitation, which he associates with testosterone  use.  His wife has been involved in discussions regarding his lab results and the implications of his elevated hematocrit. She has expressed concern about the abrupt cessation of testosterone  therapy and has been in communication with the medical team.         OBJECTIVE:       11/03/2023    8:26 AM  Depression screen PHQ 2/9  Decreased Interest 0  Down, Depressed, Hopeless 0  PHQ - 2 Score 0     BP Readings from Last 3 Encounters:  11/24/23 122/70  11/03/23 126/84  06/25/23 107/74    BP 122/70   Pulse 70   Wt 224 lb 12.8 oz (102 kg)   SpO2 99%   BMI 30.49 kg/m    Physical Exam          Physical Exam Constitutional:      Appearance: Normal appearance.  Neurological:     Mental Status: He is alert.     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 Testosterone  stopped to prevent complications. Explained potential side effects of cessation. -  Order CBC to assess hematocrit. - Restart testosterone  at lower dose if hematocrit <54%. - Withhold testosterone  and repeat CBC in 6-8 weeks if hematocrit =54%. - Ensure echocardiogram to assess cardiac function.         Shalita Notte A. Vita MD [facility]

## 2023-11-25 ENCOUNTER — Other Ambulatory Visit: Payer: Self-pay | Admitting: Family Medicine

## 2023-11-25 ENCOUNTER — Ambulatory Visit (HOSPITAL_COMMUNITY)
Admission: RE | Admit: 2023-11-25 | Discharge: 2023-11-25 | Disposition: A | Discharge status: Home / Self Care | Payer: Self-pay | Source: Ambulatory Visit | Attending: Family Medicine | Admitting: Family Medicine

## 2023-11-25 DIAGNOSIS — Z0000 Encounter for general adult medical examination without abnormal findings: Secondary | ICD-10-CM

## 2023-11-25 DIAGNOSIS — E291 Testicular hypofunction: Secondary | ICD-10-CM

## 2023-11-25 DIAGNOSIS — Z79890 Hormone replacement therapy (postmenopausal): Secondary | ICD-10-CM

## 2023-11-25 DIAGNOSIS — E7800 Pure hypercholesterolemia, unspecified: Secondary | ICD-10-CM

## 2023-11-25 DIAGNOSIS — R008 Other abnormalities of heart beat: Secondary | ICD-10-CM

## 2023-11-25 DIAGNOSIS — I10 Essential (primary) hypertension: Secondary | ICD-10-CM | POA: Insufficient documentation

## 2023-11-25 LAB — ECHOCARDIOGRAM COMPLETE
Area-P 1/2: 3.99 cm2
S' Lateral: 3.4 cm

## 2023-11-25 NOTE — Progress Notes (Signed)
  Echocardiogram 2D Echocardiogram has been performed.  Craig Byrd, RDCS 11/25/2023, 8:52 AM

## 2023-11-29 ENCOUNTER — Ambulatory Visit: Payer: Self-pay | Admitting: Family Medicine

## 2023-11-29 DIAGNOSIS — R718 Other abnormality of red blood cells: Secondary | ICD-10-CM

## 2023-12-10 ENCOUNTER — Encounter: Payer: Self-pay | Admitting: Orthopedic Surgery

## 2023-12-13 ENCOUNTER — Other Ambulatory Visit: Payer: Self-pay | Admitting: Family Medicine

## 2023-12-13 DIAGNOSIS — I10 Essential (primary) hypertension: Secondary | ICD-10-CM

## 2024-01-03 ENCOUNTER — Encounter: Payer: Self-pay | Admitting: Orthopedic Surgery

## 2024-01-03 ENCOUNTER — Ambulatory Visit (INDEPENDENT_AMBULATORY_CARE_PROVIDER_SITE_OTHER): Payer: Self-pay | Admitting: Orthopedic Surgery

## 2024-01-03 DIAGNOSIS — M7552 Bursitis of left shoulder: Secondary | ICD-10-CM

## 2024-01-03 NOTE — Progress Notes (Signed)
 Office Visit Note   Patient: Craig Byrd           Date of Birth: 1965/12/20           MRN: [id number] Visit Date: 01/03/2024 Requested by: Vita Morrow, MD [address] [place],  [place] [address] PCP: Vita Morrow, MD  Subjective: Chief Complaint  Patient presents with   Left Shoulder - Follow-up    HPI: Craig Byrd is a 58 y.o. male who presents to the office reporting Ralf is a 58 year old patient with left shoulder pain and bursitis.  Had a subacromial injection on 10/25/2023 which gave him about 80% relief for 4 weeks.  Initial injection gave him 100% relief.  The pain does wake him from sleep at night occasionally.  He is right-hand dominant.  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  No definite rotator cuff pathology based on MRI scanning..                ROS: All systems reviewed are negative as they relate to the chief complaint within the history of present illness.  Patient denies fevers or chills.  Assessment & Plan: Visit Diagnoses:  1. Bursitis of left shoulder     Plan: Impression is left shoulder bursitis.  No discrete AC joint tenderness even though he is get a little bit of edema on the MRI scan.SABRA  Has had good result with injections but short-lived.  Decision point at this time is for again subacromial decompression.  The risk and benefits are discussed.  Expected rehab time also discussed.  He is going to consider his options and let us  know if he wants to proceed with that.  Follow-Up Instructions: No follow-ups on file.   Orders:  No orders of the defined types were placed in this encounter.  No orders of the defined types were placed in this encounter.     Procedures: No procedures performed   Clinical Data: No additional findings.  Objective: Vital Signs: There were no vitals taken for this visit.  Physical Exam:  Constitutional: Patient appears well-developed HEENT:  Head:  Normocephalic Eyes:EOM are normal Neck: Normal range of motion Cardiovascular: Normal rate Pulmonary/chest: Effort normal Neurologic: Patient is alert Skin: Skin is warm Psychiatric: Patient has normal mood and affect  Ortho Exam: Ortho exam demonstrates bilateral shoulder range of motion of 65/105/160.  Rotator cuff strength is intact infraspinatus supraspinatus and subscap muscle testing.  No masses lymphadenopathy or skin changes noted in the shoulder region.  AC joint is nontender.  No pain with crossarm adduction.  No coarse grinding or crepitus with internal/external rotation of the arm.  Specialty Comments:  No specialty comments available.  Imaging: No results found.   PMFS History: Patient Active Problem List   Diagnosis Date Noted   History of colonic polyps 08/08/2020   History of COVID-19 08/08/2020   Hypertension 08/23/2017   Allergy  to alpha-gal 07/23/2017   Perennial allergic rhinitis 08/22/2015   Family history of colon cancer 08/22/2015   Erectile dysfunction 08/22/2015   Hypogonadism in male 08/22/2015   Esophageal reflux 08/22/2015   Chronic cough 10/09/2010   Past Medical History:  Diagnosis Date   ADD (attention deficit disorder)    Allergy     Angioedema 07/19/2017   Erectile dysfunction    GERD (gastroesophageal reflux disease)    Hypertension    Hypogonadism male     Family History  Problem Relation Age of Onset   Hypertension Mother  Crohn's disease Father    Colon cancer Father 54   Diabetes Father    Rectal cancer Father    Hypertension Sister    Heart disease Paternal Uncle    Pancreatic cancer Paternal Grandmother    Pancreatic cancer Paternal Grandfather    COPD Neg Hx    Colon polyps Neg Hx    Esophageal cancer Neg Hx    Stomach cancer Neg Hx     Past Surgical History:  Procedure Laterality Date   APPENDECTOMY  12/10/2003   COLONOSCOPY     VASECTOMY     Social History   Occupational History   Occupation: Product/process development scientist: [employer]   Tobacco Use   Smoking status: Never   Smokeless tobacco: Never  Vaping Use   Vaping status: Never Used  Substance and Sexual Activity   Alcohol use: Yes    Comment: 1-2 beers once a month   Drug use: No   Sexual activity: Not on file

## 2024-01-25 ENCOUNTER — Ambulatory Visit: Payer: Self-pay

## 2024-01-25 NOTE — Telephone Encounter (Signed)
 FYI Only or Action Required?: FYI only for provider.  Patient was last seen in primary care on 11/24/2023 by Vita Morrow, MD.  Called Nurse Triage reporting Dysuria.  Symptoms began yesterday.  Interventions attempted: Nothing.  Symptoms are: gradually worsening.  Triage Disposition: See Physician Within 24 Hours  Patient/caregiver understands and will follow disposition?: Yes  Copied from CRM #[id number]. Topic: Clinical - Red Word Triage >> Jan 25, 2024  4:26 PM [name] wrote: Red Word that prompted transfer to Nurse Triage: Patient is calling, requesting an appointment as soon as possible, states he is achy all over, running fevers, feels like a lump in the throat, states feels like an infection.  Patient is also having pain with urination. Reason for Disposition  All other males with painful urination  Answer Assessment - Initial Assessment Questions Offered mobile bus if pt would like to be seen sooner, declines and would rather schedule appt for tomorrow morning. Appt scheduled. Advised UC in the meantime for worsening symptoms.  1. SEVERITY: How bad is the pain?  (e.g., Scale 1-10; mild, moderate, or severe)     3-4/10 burning pain  2. FREQUENCY: How many times have you had painful urination today?      4-5 times  3. PATTERN: Is pain present every time you urinate or just sometimes?      Every time.  4. ONSET: When did the painful urination start?      1 pm today pain with urination. Fever sweats started last night.  5. FEVER: Do you have a fever? If Yes, ask: What is your temperature, how was it measured, and when did it start?     98.1 F oral during call. Does report having feverish sweats last night.  6. PAST UTI: Have you had a urine infection before? If Yes, ask: When was the last time? and What happened that time?      Never  7. CAUSE: What do you think is causing the painful urination?      Unsure  8. OTHER SYMPTOMS: Do you have any other  symptoms? (e.g., flank pain, penis discharge, scrotal pain, blood in urine)     General body aches, sore throat. No blood in urine. No flank pain.  Protocols used: Urination Pain - Male-A-AH

## 2024-01-26 ENCOUNTER — Ambulatory Visit (INDEPENDENT_AMBULATORY_CARE_PROVIDER_SITE_OTHER): Payer: Self-pay | Admitting: Family Medicine

## 2024-01-26 ENCOUNTER — Encounter: Payer: Self-pay | Admitting: Family Medicine

## 2024-01-26 VITALS — BP 122/80 | HR 85 | Wt 227.4 lb

## 2024-01-26 DIAGNOSIS — Z79890 Hormone replacement therapy (postmenopausal): Secondary | ICD-10-CM

## 2024-01-26 DIAGNOSIS — R300 Dysuria: Secondary | ICD-10-CM

## 2024-01-26 LAB — POCT URINE DIPSTICK
Glucose, UA: NEGATIVE mg/dL
Ketones, POC UA: NEGATIVE mg/dL
Nitrite, UA: POSITIVE — AB
POC PROTEIN,UA: 30 — AB
Spec Grav, UA: 1.01 (ref 1.010–1.025)
Urobilinogen, UA: 0.2 U/dL
pH, UA: 6.5 (ref 5.0–8.0)

## 2024-01-26 MED ORDER — NITROFURANTOIN MONOHYD MACRO 100 MG PO CAPS: 100.0000 mg | ORAL_CAPSULE | Freq: Two times a day (BID) | ORAL | 0 refills | Status: AC

## 2024-01-26 NOTE — Progress Notes (Signed)
   Name: Craig Byrd   Date of Visit: 01/26/24   Date of last visit with me: 12/13/2023   CHIEF COMPLAINT:  Chief Complaint  Patient presents with   Acute Visit    Burning with urination. Had night sweats last night, frequent urination, aches.        HPI:  Discussed the use of AI scribe software for clinical note transcription with the patient, who gave verbal consent to proceed.  History of Present Illness   Craig Byrd is a 58 year old male who presents with symptoms suggestive of a urinary tract infection.  He began experiencing symptoms the day before the visit, including burning urination occurring twice the previous night and an urgent need to urinate during the day, feeling like he needed to run to the bathroom. He also experienced generalized aching after lunch and had night sweats the night before the visit. No fever is reported.  He has a past medical history of epididymitis approximately twenty years ago, noting that this current episode is less severe than his previous experience.  He is currently on testosterone  therapy, taking three clicks of the medication, and reports feeling energetic. He feels energetic.  He has a known sensitivity to penicillin, which causes sun sensitivity, but has not had issues with other antibiotics in the past.         OBJECTIVE:       11/03/2023    8:26 AM  Depression screen PHQ 2/9  Decreased Interest 0  Down, Depressed, Hopeless 0  PHQ - 2 Score 0     BP Readings from Last 3 Encounters:  01/26/24 122/80  11/24/23 122/70  11/03/23 126/84    BP 122/80   Pulse 85   Wt 227 lb 6.4 oz (103.1 kg)   SpO2 98%   BMI 30.84 kg/m    Physical Exam          Physical Exam Constitutional:      Appearance: Normal appearance.  Neurological:     General: No focal deficit present.     Mental Status: He is alert and oriented to person, place, and time. Mental status is at baseline.     ASSESSMENT/PLAN:   Assessment &  Plan Burning with urination  Long-term current use of testosterone  replacement therapy    Assessment and Plan    Urinary tract infection Acute UTI with dysuria, urgency, and myalgia. No fever. Increased risk due to epididymitis. No known allergies to Macrobid, photosensitivity to penicillin. - Prescribed Macrobid 100 mg, one pill twice daily for 7 days. - Sent urine for culture to assess antibiotic resistance. - Instructed to pick up medication at [facility].  Testosterone  deficiency Testosterone  deficiency managed with topical gel. Current dose effective. Previous elevated hematocrit and hemoglobin improved. Symptoms of fatigue and weight gain improved. Monitoring testosterone  levels and hematocrit every six months. - Schedule lab visit in January to monitor testosterone  levels and hematocrit.         Jaece Ducharme A. Vita MD [facility]

## 2024-01-27 ENCOUNTER — Emergency Department (HOSPITAL_COMMUNITY): Payer: Self-pay

## 2024-01-27 ENCOUNTER — Emergency Department (HOSPITAL_COMMUNITY)
Admission: EM | Admit: 2024-01-27 | Discharge: 2024-01-27 | Disposition: A | Discharge status: Home / Self Care | Payer: Self-pay | Attending: Emergency Medicine | Admitting: Emergency Medicine

## 2024-01-27 ENCOUNTER — Encounter (HOSPITAL_COMMUNITY): Payer: Self-pay

## 2024-01-27 DIAGNOSIS — R109 Unspecified abdominal pain: Secondary | ICD-10-CM | POA: Insufficient documentation

## 2024-01-27 DIAGNOSIS — I10 Essential (primary) hypertension: Secondary | ICD-10-CM | POA: Insufficient documentation

## 2024-01-27 DIAGNOSIS — D72829 Elevated white blood cell count, unspecified: Secondary | ICD-10-CM | POA: Insufficient documentation

## 2024-01-27 DIAGNOSIS — N410 Acute prostatitis: Secondary | ICD-10-CM | POA: Insufficient documentation

## 2024-01-27 DIAGNOSIS — Z79899 Other long term (current) drug therapy: Secondary | ICD-10-CM | POA: Insufficient documentation

## 2024-01-27 LAB — CBC WITH DIFFERENTIAL/PLATELET
Abs Immature Granulocytes: 0.09 K/uL — ABNORMAL HIGH (ref 0.00–0.07)
Basophils Absolute: 0 K/uL (ref 0.0–0.1)
Basophils Relative: 0 %
Eosinophils Absolute: 0.1 K/uL (ref 0.0–0.5)
Eosinophils Relative: 0 %
HCT: 45.8 % (ref 39.0–52.0)
Hemoglobin: 15.6 g/dL (ref 13.0–17.0)
Immature Granulocytes: 0 %
Lymphocytes Relative: 6 %
Lymphs Abs: 1.1 K/uL (ref 0.7–4.0)
MCH: 28 pg (ref 26.0–34.0)
MCHC: 34.1 g/dL (ref 30.0–36.0)
MCV: 82.2 fL (ref 80.0–100.0)
Monocytes Absolute: 1.4 K/uL — ABNORMAL HIGH (ref 0.1–1.0)
Monocytes Relative: 7 %
Neutro Abs: 17.6 K/uL — ABNORMAL HIGH (ref 1.7–7.7)
Neutrophils Relative %: 87 %
Platelets: 270 K/uL (ref 150–400)
RBC: 5.57 MIL/uL (ref 4.22–5.81)
RDW: 13.6 % (ref 11.5–15.5)
WBC: 20.3 K/uL — ABNORMAL HIGH (ref 4.0–10.5)
nRBC: 0 % (ref 0.0–0.2)

## 2024-01-27 LAB — COMPREHENSIVE METABOLIC PANEL WITH GFR
ALT: 46 U/L — ABNORMAL HIGH (ref 0–44)
AST: 34 U/L (ref 15–41)
Albumin: 3.9 g/dL (ref 3.5–5.0)
Alkaline Phosphatase: 48 U/L (ref 38–126)
Anion gap: 12 (ref 5–15)
BUN: 8 mg/dL (ref 6–20)
CO2: 25 mmol/L (ref 22–32)
Calcium: 9.1 mg/dL (ref 8.9–10.3)
Chloride: 98 mmol/L (ref 98–111)
Creatinine, Ser: 1.11 mg/dL (ref 0.61–1.24)
GFR, Estimated: >60 mL/min (ref >60)
Glucose, Bld: 126 mg/dL — ABNORMAL HIGH (ref 70–99)
Potassium: 3.6 mmol/L (ref 3.5–5.1)
Sodium: 135 mmol/L (ref 135–145)
Total Bilirubin: 1.7 mg/dL — ABNORMAL HIGH (ref 0.0–1.2)
Total Protein: 6.9 g/dL (ref 6.5–8.1)

## 2024-01-27 LAB — URINALYSIS, W/ REFLEX TO CULTURE (INFECTION SUSPECTED)
Bilirubin Urine: NEGATIVE
Glucose, UA: NEGATIVE mg/dL
Ketones, ur: 20 mg/dL — AB
Nitrite: POSITIVE — AB
Protein, ur: NEGATIVE mg/dL
Specific Gravity, Urine: 1.008 (ref 1.005–1.030)
pH: 6 (ref 5.0–8.0)

## 2024-01-27 MED ORDER — KETOROLAC TROMETHAMINE 15 MG/ML IJ SOLN
15.0000 mg | Freq: Once | INTRAMUSCULAR | Status: AC
  Administered 2024-01-27: 15 mg via INTRAVENOUS
  Filled 2024-01-27: qty 1

## 2024-01-27 MED ORDER — LEVOFLOXACIN 500 MG PO TABS: 500.0000 mg | ORAL_TABLET | Freq: Every day | ORAL | 0 refills | Status: AC

## 2024-01-27 MED ORDER — HYDROCODONE-ACETAMINOPHEN 5-325 MG PO TABS: 1.0000 | ORAL_TABLET | Freq: Four times a day (QID) | ORAL | 0 refills | Status: AC | PRN

## 2024-01-27 MED ORDER — CEFTRIAXONE SODIUM 1 G IJ SOLR
1.0000 g | Freq: Once | INTRAMUSCULAR | Status: AC
  Administered 2024-01-27: 1 g via INTRAVENOUS
  Filled 2024-01-27: qty 10

## 2024-01-27 MED ORDER — ONDANSETRON HCL 4 MG/2ML IJ SOLN
4.0000 mg | Freq: Once | INTRAMUSCULAR | Status: AC
  Administered 2024-01-27: 4 mg via INTRAVENOUS
  Filled 2024-01-27: qty 2

## 2024-01-27 MED ORDER — MORPHINE SULFATE (PF) 2 MG/ML IV SOLN
4.0000 mg | Freq: Once | INTRAVENOUS | Status: AC
  Administered 2024-01-27: 4 mg via INTRAVENOUS
  Filled 2024-01-27: qty 2

## 2024-01-27 MED ORDER — OXYCODONE-ACETAMINOPHEN 5-325 MG PO TABS: 1.0000 | ORAL_TABLET | Freq: Once | ORAL | Status: DC

## 2024-01-27 MED ORDER — FENTANYL CITRATE (PF) 50 MCG/ML IJ SOSY
100.0000 ug | PREFILLED_SYRINGE | Freq: Once | INTRAMUSCULAR | Status: AC
  Administered 2024-01-27: 100 ug via INTRAVENOUS
  Filled 2024-01-27: qty 2

## 2024-01-27 MED ORDER — TAMSULOSIN HCL 0.4 MG PO CAPS: 0.4000 mg | ORAL_CAPSULE | Freq: Every day | ORAL | 0 refills | Status: DC

## 2024-01-27 MED ORDER — PHENAZOPYRIDINE HCL 200 MG PO TABS: 200.0000 mg | ORAL_TABLET | Freq: Three times a day (TID) | ORAL | 0 refills | Status: AC

## 2024-01-27 MED ORDER — ONDANSETRON 4 MG PO TBDP: 8.0000 mg | ORAL_TABLET | Freq: Once | ORAL | Status: DC

## 2024-01-27 MED ORDER — IOHEXOL 350 MG/ML SOLN
75.0000 mL | Freq: Once | INTRAVENOUS | Status: AC | PRN
  Administered 2024-01-27: 75 mL via INTRAVENOUS

## 2024-01-27 NOTE — ED Triage Notes (Signed)
 Pt POV d/t the inability to urinated and severe flank and lower ABD pain.  Pt was diagnosed with UTI yesterday (took Macrobid for 1 day and then switched to Cipro).  Pt is worse today - s/s since Monday

## 2024-01-27 NOTE — ED Provider Notes (Signed)
 [place] EMERGENCY DEPARTMENT AT [facility] Provider Note   CSN: [id number] Arrival date & time: 01/27/24  9650     Patient presents with: Dysuria   Craig Byrd is a 58 y.o. male.   The history is provided by the patient and the spouse.   Patient with history of hypertension presents for concern for urinary tract infection.  Patient reports around 2 nights ago He started having urinary frequency and dysuria. He was seen at his PCP office, diagnosed with UTI and placed on nitrofurantoin. Since that time his symptoms worsened, he is having increasing frequency and dysuria, now having back and flank pain.  He also reports chills and diaphoresis.  He has tried Pyridium with some relief.  No previous history of kidney stones. He does report prior to these episodes, he has nocturia    Prior to Admission medications   Medication Sig Start Date End Date Taking? Authorizing Provider  HYDROcodone -acetaminophen (NORCO/VICODIN) 5-325 MG tablet Take 1 tablet by mouth every 6 (six) hours as needed. 01/27/24  Yes Midge Golas, MD  levofloxacin (LEVAQUIN) 500 MG tablet Take 1 tablet (500 mg total) by mouth daily. 01/27/24  Yes Midge Golas, MD  phenazopyridine (PYRIDIUM) 200 MG tablet Take 1 tablet (200 mg total) by mouth 3 (three) times daily. 01/27/24  Yes Midge Golas, MD  tamsulosin (FLOMAX) 0.4 MG CAPS capsule Take 1 capsule (0.4 mg total) by mouth daily after supper. 01/27/24  Yes Midge Golas, MD  amLODipine  (NORVASC ) 5 MG tablet TAKE 1 TABLET BY MOUTH DAILY. 12/13/23   Jha, Panav, MD  EPINEPHrine  0.3 mg/0.3 mL IJ SOAJ injection  08/03/17   [provider]  levocetirizine (XYZAL ) 5 MG tablet Take 1 tablet (5 mg total) by mouth every evening. Patient not taking: Reported on 01/26/2024 07/14/21   Joyce Norleen BROCKS, MD  lisinopril -hydrochlorothiazide  (ZESTORETIC ) 20-12.5 MG tablet TAKE 1 TABLET BY MOUTH EVERY MORNING 12/13/23   Jha, Panav, MD  Multiple Vitamin  (MULTIVITAMIN) capsule Take 1 capsule by mouth daily.    [provider]  nitrofurantoin, macrocrystal-monohydrate, (MACROBID) 100 MG capsule Take 1 capsule (100 mg total) by mouth 2 (two) times daily for 7 days. Take 1 cap by mouth every 12 hours for 5 days for UTI. 01/26/24 02/02/24  Vita Morrow, MD  NONFORMULARY OR COMPOUNDED ITEM Testosterone  12% 120mg /ml. Apply 1.25ml (5 clicks) daily. (patient wants 6 months at a time called in- 75g with 2 refills= 6 months) Patient taking differently: Testosterone  12% 120mg /ml. Apply 1.25ml (5 clicks) daily. (patient wants 6 months at a time called in- 75g with 2 refills= 6 months) 09/07/23   Joyce Norleen BROCKS, MD  omeprazole  (PRILOSEC) 20 MG capsule Take 1 capsule (20 mg total) by mouth daily. 08/05/22   Joyce Norleen BROCKS, MD  tadalafil  (CIALIS ) 20 MG tablet TAKE 1 TABLET AS DIRECTED AS NEEDED 09/06/23   Joyce Norleen BROCKS, MD    Allergies: Patient has no known allergies.    Review of Systems  Constitutional:  Positive for chills and diaphoresis.  Genitourinary:  Positive for decreased urine volume, difficulty urinating, dysuria, flank pain and urgency. Negative for scrotal swelling and testicular pain.  Musculoskeletal:  Positive for back pain.    Updated Vital Signs BP 133/82 (BP Location: Right Arm)   Pulse 89   Temp 98 F (36.7 C) (Oral)   Resp 16   Ht 1.829 m (6')   Wt 99.8 kg   SpO2 92%   BMI 29.84 kg/m   Physical Exam  CONSTITUTIONAL: Well developed/well nourished, uncomfortable appearing HEAD: Normocephalic/atraumatic ENMT: Mucous membranes moist NECK: supple no meningeal signs CV: S1/S2 noted, no murmurs/rubs/gallops noted LUNGS: Lungs are clear to auscultation bilaterally, no apparent distress ABDOMEN: soft, nontender, no rebound or guarding, bowel sounds noted throughout abdomen GU:no cva tenderness Patient is circumcised.  There is no penile lesions.  Testicles descended bilaterally without any tenderness, no overlying erythema, no  crepitance.  No inguinal hernia.  Chaperone present for exam. Prostate is enlarged and tender to palpation.  No rectal mass or abscess.  No melena or gross blood noted Chaperone present for exam NEURO: Pt is awake/alert/appropriate, moves all extremitiesx4.  No facial droop.    (all labs ordered are listed, but only abnormal results are displayed) Labs Reviewed  CBC WITH DIFFERENTIAL/PLATELET - Abnormal; Notable for the following components:      Result Value   WBC 20.3 (*)    Neutro Abs 17.6 (*)    Monocytes Absolute 1.4 (*)    Abs Immature Granulocytes 0.09 (*)    All other components within normal limits  URINALYSIS, W/ REFLEX TO CULTURE (INFECTION SUSPECTED) - Abnormal; Notable for the following components:   Color, Urine AMBER (*)    Hgb urine dipstick SMALL (*)    Ketones, ur 20 (*)    Nitrite POSITIVE (*)    Leukocytes,Ua MODERATE (*)    Bacteria, UA RARE (*)    All other components within normal limits  COMPREHENSIVE METABOLIC PANEL WITH GFR - Abnormal; Notable for the following components:   Glucose, Bld 126 (*)    ALT 46 (*)    Total Bilirubin 1.7 (*)    All other components within normal limits  URINE CULTURE    EKG: None  Radiology: CT ABDOMEN PELVIS W CONTRAST Result Date: 01/27/2024 EXAM: CT ABDOMEN AND PELVIS WITH CONTRAST 01/27/2024 05:17:37 AM TECHNIQUE: CT of the abdomen and pelvis was performed with the administration of 75 mL of iohexol (OMNIPAQUE) 350 MG/ML injection. Multiplanar reformatted images are provided for review. Automated exposure control, iterative reconstruction, and/or weight-based adjustment of the mA/kV was utilized to reduce the radiation dose to as low as reasonably achievable. COMPARISON: 12/28/2003 CLINICAL HISTORY: Abdominal/flank pain, stone suspected. 75cc omni350; Pt POV d/t the inability to urinated and severe flank and lower ABD pain. Pt was diagnosed with UTI yesterday (took Macrobid for 1 day and then switched to Cipro). Pt is worse  today - s/s since Monday FINDINGS: LOWER CHEST: Scar-like density within the anterior left base. No pleural effusion or airspace consolidation. LIVER: The liver is unremarkable. GALLBLADDER AND BILE DUCTS: Gallbladder is unremarkable. No biliary ductal dilatation. SPLEEN: The spleen appears normal. PANCREAS: The pancreas is normal in size and contour without a focal lesion or ductal dilatation. ADRENAL GLANDS: Normal size and morphology bilaterally. No nodule, thickening, or hemorrhage. No periadrenal stranding. KIDNEYS, URETERS AND BLADDER: Several, less than 1 cm low attenuation kidney lesions are noted. The largest is in the anterior cortex of the right kidney, measuring 8 mm. 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 No stones in the kidneys or ureters. No hydronephrosis. No perinephric or periureteral stranding. Mass effect upon the base of the bladder due to prostate gland enlargement. Urinary bladder is unremarkable. GI AND BOWEL: Small hiatal hernia. Status post appendectomy. Tiny fat-containing periumbilical hernia. Stomach demonstrates no acute abnormality. There is no bowel obstruction. PERITONEUM AND RETROPERITONEUM: No ascites.  No free air. VASCULATURE: Aorta is normal in caliber. Aortic atherosclerotic calcifications. LYMPH NODES: No lymphadenopathy. REPRODUCTIVE ORGANS: Prostate gland enlargement. BONES AND SOFT TISSUES: No acute osseous abnormality. No focal soft tissue abnormality. IMPRESSION: 1. No acute findings in the abdomen or pelvis. 2. Aortic atherosclerotic calcifications. Electronically signed by: Waddell Calk MD 01/27/2024 05:41 AM EDT RP Workstation: [id number]     Procedures   Medications Ordered in the ED  cefTRIAXone (ROCEPHIN) 1 g in sodium chloride  0.9 % 100 mL IVPB (1 g Intravenous New Bag/Given 01/27/24 0618)  ondansetron (ZOFRAN) injection 4 mg (4  mg Intravenous Given 01/27/24 0436)  morphine (PF) 2 MG/ML injection 4 mg (4 mg Intravenous Given 01/27/24 0437)  iohexol (OMNIPAQUE) 350 MG/ML injection 75 mL (75 mLs Intravenous Contrast Given 01/27/24 0511)  ketorolac (TORADOL) 15 MG/ML injection 15 mg (15 mg Intravenous Given 01/27/24 0617)    Clinical Course as of 01/27/24 0640  Thu Jan 27, 2024  0639 Patient overall well-appearing, no acute distress, he is not septic appearing  He has been loaded with Rocephin here. He will be treated as an outpatient for prostatitis for 2 weeks.  No need for surgical intervention or admission at this time.  Patient has tolerated Levaquin in the past, and I feel this is the best therapy for this condition.  Patient was taking Cipro that his wife was giving him that he already had at the house, but he will stop that and start the Levaquin  He will also be started on Flomax and refer to urology [DW]    Clinical Course User Index [DW] Midge Golas, MD                                 Medical Decision Making Risk Prescription drug management.   This patient presents to the ED for concern of dysuria and flank pain/back pain, this involves an extensive number of treatment options, and is a complaint that carries with it a high risk of complications and morbidity.  The differential diagnosis includes but is not limited to UTI, ureteral stone, prostatitis, epididymitis, orchitis, urethritis  Comorbidities that complicate the patient evaluation: Patient's presentation is complicated by their history of hypertension  Social Determinants of Health: Patient's lack of insurance  increases the complexity of managing their presentation  Additional history obtained: Additional history obtained from spouse Records reviewed Primary Care Documents  Lab Tests: I Ordered, and personally interpreted labs.  The pertinent results include: Leukocytosis, urinary tract infection  Imaging Studies ordered: I  ordered imaging studies including CT scan abdomen pelvis  I independently visualized and interpreted imaging which showed no ureteral stone, prostate enlargement I agree with the radiologist interpretation   Medicines ordered and prescription drug management: I ordered medication including Toradol for pain Reevaluation of the patient after these medicines showed that the patient    improved  Critical Interventions:   IV Rocephin   Reevaluation: After the interventions noted above, I reevaluated the patient and found that they have :improved  Complexity of problems addressed: Patient's presentation is most consistent with  acute presentation with potential threat to life or bodily function  Disposition: After consideration of the diagnostic results and the patient's response to treatment,  I feel that the patent would benefit from discharge  .        Final diagnoses:  Acute prostatitis    ED Discharge Orders  Ordered    HYDROcodone -acetaminophen (NORCO/VICODIN) 5-325 MG tablet  Every 6 hours PRN        01/27/24 0620    levofloxacin (LEVAQUIN) 500 MG tablet  Daily        01/27/24 0620    tamsulosin (FLOMAX) 0.4 MG CAPS capsule  Daily after supper        01/27/24 0620    phenazopyridine (PYRIDIUM) 200 MG tablet  3 times daily        01/27/24 9360               Midge Golas, MD 01/27/24 [phone]

## 2024-01-29 ENCOUNTER — Encounter: Payer: Self-pay | Admitting: Family Medicine

## 2024-01-29 DIAGNOSIS — N401 Enlarged prostate with lower urinary tract symptoms: Secondary | ICD-10-CM

## 2024-01-29 LAB — URINE CULTURE: Culture: 10000 — AB

## 2024-01-30 ENCOUNTER — Telehealth (HOSPITAL_BASED_OUTPATIENT_CLINIC_OR_DEPARTMENT_OTHER): Payer: Self-pay | Admitting: *Deleted

## 2024-01-30 NOTE — Telephone Encounter (Signed)
 Post ED Visit - Positive Culture Follow-up  Culture report reviewed by antimicrobial stewardship pharmacist: [facility] []  Rankin Dee, Pharm.D. []  Venetia Gully, Pharm.D., BCPS AQ-ID []  Garrel Crews, Pharm.D., BCPS []  Almarie Lunger, Pharm.D., BCPS []  [place], [address].D., BCPS, AAHIVP []  Rosaline Bihari, Pharm.D., BCPS, AAHIVP []  Vernell Meier, PharmD, BCPS []  Latanya Hint, PharmD, BCPS []  Donald Medley, PharmD, BCPS []  Rocky Bold, PharmD []  Dorothyann Alert, PharmD, BCPS [x]  Dorn Buttner, PharmD  [facility] []  Rosaline Edison, PharmD []  Romona Bliss, PharmD []  Dolphus Roller, PharmD []  Veva Seip, Rph []  Vernell Daunt) Leonce, PharmD []  Eva Allis, PharmD []  Rosaline Millet, PharmD []  Iantha Batch, PharmD []  Arvin Gauss, PharmD []  Wanda Hasting, PharmD []  Ronal Rav, PharmD []  Rocky Slade, PharmD []  Bard Jeans, PharmD   Positive urine culture Treated with Levofloxacin. Called pt to perform symptom check.  Pt stated he is feeling much better and things are improving. I adivised pt to finish Levaquin and follow up with PCP and urology and return to ED if he starts having worsening symptoms per Prentice Medicus, MD..  Pt understands  Albino Alan Novak 01/30/2024, 1:28 PM

## 2024-01-30 NOTE — Progress Notes (Signed)
 ED Antimicrobial Stewardship Positive Culture Follow Up   Craig Byrd is an 58 y.o. male who presented to [facility] on @ADMITDT @ with a chief complaint of  Chief Complaint  Patient presents with   Dysuria    Recent Results (from the past 720 hours)  Urine Culture     Status: Abnormal   Collection Time: 01/26/24  8:58 AM   Specimen: Urine   UR  Result Value Ref Range Status   Urine Culture, Routine Final report (A)  Final   Organism ID, Bacteria Escherichia coli (A)  Final    Comment: Susceptibility profile is consistent with a probable ESBL. Multi-Drug Resistant Organism Greater than 100,000 colony forming units per mL    Antimicrobial Susceptibility Comment  Final    Comment:       ** S = Susceptible; I = Intermediate; R = Resistant **                    P = Positive; N = Negative             MICS are expressed in micrograms per mL    Antibiotic                 RSLT#1    RSLT#2    RSLT#3    RSLT#4 Amoxicillin /Clavulanic Acid    S Ampicillin                     R Cefazolin                      R Cefepime                       R Cefoxitin                      S Cefpodoxime                    R Ceftriaxone                    R Ciprofloxacin                  I Ertapenem                      S Gentamicin                     S Levofloxacin                   I Meropenem                      S Nitrofurantoin                 S Piperacillin/Tazobactam        S Tetracycline                   R Tobramycin                     S Trimethoprim/Sulfa             R   Urine Culture     Status: Abnormal   Collection Time: 01/27/24  4:11 AM   Specimen: Urine, Random  Result Value Ref Range Status   Specimen Description URINE, RANDOM  Final   Special Requests  Final    NONE Reflexed from [id number] Performed at [facility], [address], [place], [place] [address]    Culture 10,000 COLONIES/mL STAPHYLOCOCCUS EPIDERMIDIS (A)  Final   Report Status 01/29/2024 FINAL  Final    Organism ID, Bacteria STAPHYLOCOCCUS EPIDERMIDIS (A)  Final      Susceptibility   Staphylococcus epidermidis - MIC*    CIPROFLOXACIN 2 INTERMEDIATE Intermediate     GENTAMICIN 8 INTERMEDIATE Intermediate     NITROFURANTOIN <=16 SENSITIVE Sensitive     OXACILLIN <=0.25 SENSITIVE Sensitive     TETRACYCLINE 2 SENSITIVE Sensitive     VANCOMYCIN 2 SENSITIVE Sensitive     TRIMETH/SULFA 20 SENSITIVE Sensitive     RIFAMPIN <=0.5 SENSITIVE Sensitive     Inducible Clindamycin  NEGATIVE Sensitive     * 10,000 COLONIES/mL STAPHYLOCOCCUS EPIDERMIDIS    [x]  Patient recently started on Macrobid then came to ED with worsening symptoms. Patient started on levaquin for acute prostatitis. I can see in chart where wife has messaged PCP inquiring about outpatient e coli and the culture above but also states patient is improving.   After discussing with ED MD, please check with wife and see if symptoms are still improving.  Yes >> Please give return precautions and have patient follow up with PCP and urology as planned and continue with levaquin. No >> Call me and I will come up with plan with ED MD.  Thanks!!   ED Provider: Prentice Medicus MD   Dorn Buttner, PharmD, BCPS 01/30/2024 10:24 AM ED Clinical Pharmacist -  [phone]

## 2024-01-31 ENCOUNTER — Other Ambulatory Visit: Payer: Self-pay

## 2024-02-01 ENCOUNTER — Ambulatory Visit: Payer: Self-pay | Admitting: Family Medicine

## 2024-02-02 ENCOUNTER — Telehealth: Payer: Self-pay

## 2024-02-02 NOTE — Telephone Encounter (Signed)
 Copied from CRM [id number]. Topic: Clinical - Medication Question >> Feb 02, 2024  2:09 PM Yolanda T wrote: Reason for CRM: patient called to see if provider wants him to stay on the antibiotic the hospital put him on or if Dr Vita would be changing it. Patient stated all the info was placed in the mychart note to provider on 10/27. Please f/u with patient

## 2024-02-07 ENCOUNTER — Encounter: Payer: Self-pay | Admitting: Radiology

## 2024-02-07 MED ORDER — FINASTERIDE 5 MG PO TABS: 5.0000 mg | ORAL_TABLET | Freq: Every day | ORAL | 1 refills | Status: AC

## 2024-02-07 MED ORDER — TAMSULOSIN HCL 0.4 MG PO CAPS: 0.4000 mg | ORAL_CAPSULE | Freq: Every day | ORAL | 1 refills | Status: AC

## 2024-02-11 ENCOUNTER — Other Ambulatory Visit: Payer: Self-pay | Admitting: Family Medicine

## 2024-02-11 DIAGNOSIS — J3089 Other allergic rhinitis: Secondary | ICD-10-CM

## 2024-02-14 ENCOUNTER — Other Ambulatory Visit: Payer: Self-pay | Admitting: Family Medicine

## 2024-02-14 DIAGNOSIS — E291 Testicular hypofunction: Secondary | ICD-10-CM

## 2024-02-14 MED ORDER — NONFORMULARY OR COMPOUNDED ITEM: 0 refills | Status: DC

## 2024-04-13 ENCOUNTER — Encounter: Payer: Self-pay | Admitting: Family Medicine

## 2024-04-13 DIAGNOSIS — E291 Testicular hypofunction: Secondary | ICD-10-CM

## 2024-04-13 MED ORDER — NONFORMULARY OR COMPOUNDED ITEM: 2 refills | Status: DC

## 2024-04-28 ENCOUNTER — Telehealth: Payer: Self-pay | Admitting: Internal Medicine

## 2024-04-28 DIAGNOSIS — E291 Testicular hypofunction: Secondary | ICD-10-CM

## 2024-04-28 MED ORDER — NONFORMULARY OR COMPOUNDED ITEM: 0 refills | Status: AC

## 2024-04-28 NOTE — Telephone Encounter (Signed)
 Per Dr. Vita last message. It was going to send in for a 6 month supply.  225g is = 6 month supply

## 2024-04-28 NOTE — Telephone Encounter (Signed)
 SABRA

## 2024-04-29 ENCOUNTER — Other Ambulatory Visit: Payer: Self-pay | Admitting: Family Medicine

## 2024-04-29 DIAGNOSIS — J3089 Other allergic rhinitis: Secondary | ICD-10-CM

## 2024-05-02 ENCOUNTER — Other Ambulatory Visit: Payer: Self-pay | Admitting: Family Medicine

## 2024-05-02 DIAGNOSIS — I10 Essential (primary) hypertension: Secondary | ICD-10-CM

## 2024-05-11 ENCOUNTER — Ambulatory Visit
Admission: RE | Admit: 2024-05-11 | Discharge: 2024-05-11 | Disposition: A | Discharge status: Home / Self Care | Payer: Self-pay | Source: Ambulatory Visit

## 2024-05-11 VITALS — BP 110/71 | HR 79 | Temp 98.7°F | Resp 20 | Ht 72.0 in | Wt 225.0 lb

## 2024-05-11 DIAGNOSIS — M545 Low back pain, unspecified: Secondary | ICD-10-CM

## 2024-05-11 DIAGNOSIS — M79652 Pain in left thigh: Secondary | ICD-10-CM

## 2024-05-11 DIAGNOSIS — M79602 Pain in left arm: Secondary | ICD-10-CM

## 2024-05-11 MED ORDER — METHOCARBAMOL 500 MG PO TABS: 500.0000 mg | ORAL_TABLET | Freq: Four times a day (QID) | ORAL | 0 refills | Status: AC

## 2024-05-11 NOTE — Discharge Instructions (Addendum)
 Return if any problems.
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
## 2024-07-07 ENCOUNTER — Encounter: Payer: Self-pay | Admitting: Family Medicine
